# Patient Record
Sex: Female | Born: 1980 | Race: Black or African American | Hispanic: No | Marital: Single | Smoking: Never smoker
Health system: Southern US, Community
[De-identification: ages and names within clinical notes are randomized; demographics above are authoritative.]

## PROBLEM LIST (undated history)

## (undated) ENCOUNTER — Inpatient Hospital Stay (HOSPITAL_COMMUNITY): Payer: Self-pay

## (undated) DIAGNOSIS — R011 Cardiac murmur, unspecified: Secondary | ICD-10-CM

## (undated) DIAGNOSIS — D649 Anemia, unspecified: Secondary | ICD-10-CM

## (undated) DIAGNOSIS — O99210 Obesity complicating pregnancy, unspecified trimester: Secondary | ICD-10-CM

## (undated) DIAGNOSIS — Z98891 History of uterine scar from previous surgery: Secondary | ICD-10-CM

## (undated) HISTORY — PX: TONSILLECTOMY: SUR1361

## (undated) HISTORY — PX: OTHER SURGICAL HISTORY: SHX169

## (undated) HISTORY — PX: WISDOM TOOTH EXTRACTION: SHX21

## (undated) SURGERY — Surgical Case
Anesthesia: *Unknown

---

## 2012-11-07 ENCOUNTER — Encounter (HOSPITAL_COMMUNITY): Payer: Self-pay | Admitting: Emergency Medicine

## 2012-11-07 ENCOUNTER — Emergency Department (HOSPITAL_COMMUNITY)
Admission: EM | Admit: 2012-11-07 | Discharge: 2012-11-07 | Disposition: A | Discharge status: Home / Self Care | Payer: Medicaid Other | Attending: Emergency Medicine | Admitting: Emergency Medicine

## 2012-11-07 DIAGNOSIS — Z79899 Other long term (current) drug therapy: Secondary | ICD-10-CM | POA: Insufficient documentation

## 2012-11-07 DIAGNOSIS — R011 Cardiac murmur, unspecified: Secondary | ICD-10-CM | POA: Insufficient documentation

## 2012-11-07 DIAGNOSIS — K089 Disorder of teeth and supporting structures, unspecified: Secondary | ICD-10-CM | POA: Insufficient documentation

## 2012-11-07 DIAGNOSIS — R079 Chest pain, unspecified: Secondary | ICD-10-CM | POA: Insufficient documentation

## 2012-11-07 DIAGNOSIS — H9319 Tinnitus, unspecified ear: Secondary | ICD-10-CM | POA: Insufficient documentation

## 2012-11-07 DIAGNOSIS — Z3202 Encounter for pregnancy test, result negative: Secondary | ICD-10-CM | POA: Insufficient documentation

## 2012-11-07 DIAGNOSIS — M2550 Pain in unspecified joint: Secondary | ICD-10-CM | POA: Insufficient documentation

## 2012-11-07 DIAGNOSIS — J329 Chronic sinusitis, unspecified: Secondary | ICD-10-CM

## 2012-11-07 DIAGNOSIS — H9209 Otalgia, unspecified ear: Secondary | ICD-10-CM | POA: Insufficient documentation

## 2012-11-07 DIAGNOSIS — K0889 Other specified disorders of teeth and supporting structures: Secondary | ICD-10-CM

## 2012-11-07 DIAGNOSIS — J3489 Other specified disorders of nose and nasal sinuses: Secondary | ICD-10-CM | POA: Insufficient documentation

## 2012-11-07 HISTORY — DX: Cardiac murmur, unspecified: R01.1

## 2012-11-07 LAB — URINALYSIS, ROUTINE W REFLEX MICROSCOPIC
Bilirubin Urine: NEGATIVE
Nitrite: NEGATIVE
Specific Gravity, Urine: 1.02 (ref 1.005–1.030)
Urobilinogen, UA: 0.2 mg/dL (ref 0.0–1.0)

## 2012-11-07 LAB — URINE MICROSCOPIC-ADD ON

## 2012-11-07 MED ORDER — AMOXICILLIN-POT CLAVULANATE 500-125 MG PO TABS: 1.0000 | ORAL_TABLET | Freq: Three times a day (TID) | ORAL | Status: DC

## 2012-11-07 MED ORDER — OXYMETAZOLINE HCL 0.05 % NA SOLN: 2.0000 | Freq: Two times a day (BID) | NASAL | Status: DC

## 2012-11-07 NOTE — Progress Notes (Signed)
[facility] ED CM noted pt with medicaid coverage but no pcp listed Spoke with pt who confirms no pcp   [facility] ED CM spoke with pt on how to obtain an in network pcp with medicaid insurance coverage via DSS case worker (to prevent use of a non medicaid provider)  or web site Cm answered question about dental services and offered resources as following Pt reports having tooth removed in [place] on her left upper mouth and informed "nerve had to be removed" but told by dentist staff seen recently that they would not remove the nerve  [facility] - [phone]- orange card accessible [address] [facility] - [phone] Medication Assistance Program (MAP) [phone] Pharmacy -[phone] Vital [phone] [facility] Inf[facility]) - [phone] Dental Problems [facility] -Dr. Maurice March  No longer accept Medicaid Does see all other insurance plans & self pay with payment up front $[phone] [address] [id number] [address] --[phone] or [phone]  If unable to pay or uninsured, contact: [facility] to become qualified for the adult dental clinic. [facility] and [facility] both have dental clinics completed by students [facility] [phone] ext [phone] --[facility] [phone] & Nuala Alpha [address] [phone] approx $200 (paid up front) [facility] dental clinic [address] [place] [address] approx [phone]  DENTAL SERVICES UNINSURED/NO INSURANCE COVERAGE OR WITH INSURANCE hCALL 1 [phone]

## 2012-11-07 NOTE — ED Notes (Signed)
Pt states she has had a tooth ache for a week and at 4am this morning began having chest pain on the left side followed by a severe headache. Pt received antibiotics for tooth and is scheduled for surgery consultation in July to have tooth removed. Pt states she did not finish full course of antibiotics.

## 2012-11-07 NOTE — ED Provider Notes (Signed)
History     CSN: [id number]  Arrival date & time 11/07/12  1127   First MD Initiated Contact with Patient 11/07/12 1148      Chief Complaint  Patient presents with  . Chest Pain  . Dental Pain    (Consider location/radiation/quality/duration/timing/severity/associated sxs/prior treatment) HPI Pt with 1 week of L facial pain, L ear pain and L upper molar pain. Pt states she saw dentist yesterday and wanted the tooth removed. Dentist started her on abx and pain peds. Pt states pain worsened through the night and she noticed left upper chest pain worse with palpation. No SOB, cough, fever, chills, difficulty swallowing. No abd pain N/V/D. No focal weakness, numbness, or visual changes. +nasa congestion.  Past Medical History  Diagnosis Date  . Heart murmur     History reviewed. No pertinent past surgical history.  History reviewed. No pertinent family history.  History  Substance Use Topics  . Smoking status: Never Smoker   . Smokeless tobacco: Not on file  . Alcohol Use: Yes     Comment: occasionally    OB History   Grav Para Term Preterm Abortions TAB SAB Ect Mult Living   1               Review of Systems  Constitutional: Negative for fever and chills.  HENT: Positive for ear pain, congestion, dental problem, sinus pressure and tinnitus. Negative for sore throat, rhinorrhea, trouble swallowing, neck pain, neck stiffness and voice change.   Respiratory: Negative for cough, shortness of breath and wheezing.   Cardiovascular: Negative for chest pain, palpitations and leg swelling.  Gastrointestinal: Negative for nausea, vomiting, abdominal pain and diarrhea.  Genitourinary: Negative for dysuria, frequency and flank pain.  Musculoskeletal: Positive for myalgias. Negative for back pain and arthralgias.  Skin: Negative for pallor, rash and wound.  Neurological: Negative for dizziness, weakness, light-headedness, numbness and headaches.  All other systems reviewed and are  negative.    Allergies  Review of patient's allergies indicates no known allergies.  Home Medications   Current Outpatient Rx  Name  Route  Sig  Dispense  Refill  . acetaminophen-codeine (TYLENOL #3) 300-30 MG per tablet   Oral   Take 1 tablet by mouth every 4 (four) hours as needed for pain.         . Prenatal Vit-Fe Fumarate-FA (PRENATAL MULTIVITAMIN) TABS   Oral   Take 1 tablet by mouth every morning.         Marland Kitchen amoxicillin-clavulanate (AUGMENTIN) 500-125 MG per tablet   Oral   Take 1 tablet (500 mg total) by mouth every 8 (eight) hours.   21 tablet   0   . oxymetazoline (AFRIN NASAL SPRAY) 0.05 % nasal spray   Nasal   Place 2 sprays into the nose 2 (two) times daily.   30 mL   0     BP 118/78  Pulse 65  Temp(Src) 97.9 F (36.6 C) (Oral)  Resp 17  Ht 5\' 3"  (1.6 m)  Wt 340 lb (154.223 kg)  BMI 60.24 kg/m2  SpO2 99%  Physical Exam  Nursing note and vitals reviewed. Constitutional: She is oriented to person, place, and time. She appears well-developed and well-nourished. No distress.  HENT:  Head: Normocephalic and atraumatic.  Right Ear: External ear normal.  Left Ear: External ear normal.  Mouth/Throat: Oropharynx is clear and moist. No oropharyngeal exudate.    +L>R nasal turbinate swelling. L frontal sinus/maxillary sinus swelling.  Very mild TTP L  upper molar, No swelling or redness. +denal carry   Eyes: EOM are normal. Pupils are equal, round, and reactive to light.  Neck: Normal range of motion. Neck supple.  Cardiovascular: Normal rate and regular rhythm.   Pulmonary/Chest: Effort normal and breath sounds normal. No respiratory distress. She has no wheezes. She has no rales. She exhibits tenderness (L upper chest wall TTp. No deformity or crepitance).  Abdominal: Soft. Bowel sounds are normal. She exhibits no distension and no mass. There is no tenderness. There is no rebound and no guarding.  Musculoskeletal: Normal range of motion. She exhibits  no edema and no tenderness.  No calf swelling or pain  Neurological: She is alert and oriented to person, place, and time.  Moves all ext without deficit  Skin: Skin is warm and dry. No rash noted. No erythema.  Psychiatric: She has a normal mood and affect. Her behavior is normal.    ED Course  Procedures (including critical care time)  Labs Reviewed  URINALYSIS, ROUTINE W REFLEX MICROSCOPIC - Abnormal; Notable for the following:    APPearance CLOUDY (*)    Leukocytes, UA MODERATE (*)    All other components within normal limits  URINE MICROSCOPIC-ADD ON - Abnormal; Notable for the following:    Squamous Epithelial / LPF MANY (*)    Bacteria, UA MANY (*)    All other components within normal limits  POCT PREGNANCY, URINE - Abnormal; Notable for the following:    Preg Test, Ur POSITIVE (*)    All other components within normal limits  URINE CULTURE   No results found.   1. Sinusitis   2. Pain, dental       MDM  Will have f/u with dentist and will treat for sinusitis.         Loren Racer, MD 11/07/12 [phone]

## 2012-11-07 NOTE — ED Notes (Signed)
MD at bedside. 

## 2012-11-08 LAB — URINE CULTURE
Colony Count: NO GROWTH
Culture: NO GROWTH

## 2012-11-21 ENCOUNTER — Ambulatory Visit (INDEPENDENT_AMBULATORY_CARE_PROVIDER_SITE_OTHER): Payer: Medicaid Other | Admitting: Obstetrics

## 2012-11-21 ENCOUNTER — Encounter: Payer: Self-pay | Admitting: Obstetrics

## 2012-11-21 VITALS — BP 122/78 | Temp 98.6°F | Wt 329.4 lb

## 2012-11-21 DIAGNOSIS — Z3481 Encounter for supervision of other normal pregnancy, first trimester: Secondary | ICD-10-CM

## 2012-11-21 DIAGNOSIS — Z3480 Encounter for supervision of other normal pregnancy, unspecified trimester: Secondary | ICD-10-CM

## 2012-11-21 DIAGNOSIS — Z113 Encounter for screening for infections with a predominantly sexual mode of transmission: Secondary | ICD-10-CM

## 2012-11-21 LAB — POCT URINALYSIS DIPSTICK
Bilirubin, UA: NEGATIVE
Blood, UA: NEGATIVE
Glucose, UA: NEGATIVE
Nitrite, UA: NEGATIVE
Spec Grav, UA: 1.02

## 2012-11-21 NOTE — Progress Notes (Signed)
Pulse- 80.  Patient states that she is having slight cramping. . Subjective:    Christine Weber is being seen today for her first obstetrical visit.  This is not a planned pregnancy. She is at [redacted]w[redacted]d gestation. Her obstetrical history is significant for obesity. Relationship with FOB: significant other, living together. Patient does intend to breast feed. Pregnancy history fully reviewed.  Menstrual History: OB History   Grav Para Term Preterm Abortions TAB SAB Ect Mult Living   3 2 2       2       Menarche age: 50 :Patient's last menstrual period was 09/05/2012.    The following portions of the patient's history were reviewed and updated as appropriate: allergies, current medications, past family history, past medical history, past social history, past surgical history and problem list.  Review of Systems Pertinent items are noted in HPI.    Objective:    General appearance: alert and no distress Abdomen: normal findings: soft, non-tender Pelvic: cervix normal in appearance, external genitalia normal, no adnexal masses or tenderness, no cervical motion tenderness, uterus normal size, shape, and consistency and vagina normal without discharge    Assessment:    Pregnancy at [redacted]w[redacted]d weeks    Plan:    Initial labs drawn. Prenatal vitamins. Problem list reviewed and updated. AFP3 discussed: requested. Role of ultrasound in pregnancy discussed; fetal survey: requested. Amniocentesis discussed: not indicated. Follow up in 4 weeks. 50% of 20 min visit spent on counseling and coordination of care.

## 2012-11-22 ENCOUNTER — Encounter: Payer: Self-pay | Admitting: Obstetrics

## 2012-11-22 LAB — OBSTETRIC PANEL
Basophils Absolute: 0 10*3/uL (ref 0.0–0.1)
Basophils Relative: 0 % (ref 0–1)
Eosinophils Absolute: 0.1 10*3/uL (ref 0.0–0.7)
Hepatitis B Surface Ag: NEGATIVE
MCH: 26.8 pg (ref 26.0–34.0)
MCHC: 33.3 g/dL (ref 30.0–36.0)
Neutrophils Relative %: 54 % (ref 43–77)
Platelets: 200 10*3/uL (ref 150–400)
RBC: 4.29 MIL/uL (ref 3.87–5.11)
RDW: 17.1 % — ABNORMAL HIGH (ref 11.5–15.5)

## 2012-11-22 LAB — GC/CHLAMYDIA PROBE AMP
CT Probe RNA: NEGATIVE
GC Probe RNA: NEGATIVE

## 2012-11-22 LAB — HIV ANTIBODY (ROUTINE TESTING W REFLEX): HIV: NONREACTIVE

## 2012-11-22 LAB — WET PREP BY MOLECULAR PROBE
Candida species: NEGATIVE
Gardnerella vaginalis: POSITIVE — AB

## 2012-11-26 LAB — HEMOGLOBINOPATHY EVALUATION
Hemoglobin Other: 0 %
Hgb A2 Quant: 2.4 % (ref 2.2–3.2)
Hgb F Quant: 1.6 % (ref 0.0–2.0)

## 2012-12-19 ENCOUNTER — Encounter: Payer: Medicaid Other | Admitting: Obstetrics

## 2013-01-21 ENCOUNTER — Other Ambulatory Visit (HOSPITAL_COMMUNITY): Payer: Self-pay | Admitting: Obstetrics and Gynecology

## 2013-01-21 DIAGNOSIS — Z1231 Encounter for screening mammogram for malignant neoplasm of breast: Secondary | ICD-10-CM

## 2013-02-14 ENCOUNTER — Ambulatory Visit (HOSPITAL_COMMUNITY)
Admission: RE | Admit: 2013-02-14 | Discharge: 2013-02-14 | Disposition: A | Discharge status: Home / Self Care | Payer: Medicaid Other | Source: Ambulatory Visit | Attending: Obstetrics and Gynecology | Admitting: Obstetrics and Gynecology

## 2013-02-14 ENCOUNTER — Other Ambulatory Visit (HOSPITAL_COMMUNITY): Payer: Self-pay | Admitting: Obstetrics and Gynecology

## 2013-02-14 DIAGNOSIS — O358XX0 Maternal care for other (suspected) fetal abnormality and damage, not applicable or unspecified: Secondary | ICD-10-CM | POA: Insufficient documentation

## 2013-02-14 DIAGNOSIS — Z363 Encounter for antenatal screening for malformations: Secondary | ICD-10-CM | POA: Insufficient documentation

## 2013-02-14 DIAGNOSIS — Z1231 Encounter for screening mammogram for malignant neoplasm of breast: Secondary | ICD-10-CM

## 2013-02-14 DIAGNOSIS — Z1389 Encounter for screening for other disorder: Secondary | ICD-10-CM | POA: Insufficient documentation

## 2013-02-14 DIAGNOSIS — E669 Obesity, unspecified: Secondary | ICD-10-CM | POA: Insufficient documentation

## 2013-02-14 DIAGNOSIS — O34219 Maternal care for unspecified type scar from previous cesarean delivery: Secondary | ICD-10-CM | POA: Insufficient documentation

## 2013-03-13 ENCOUNTER — Other Ambulatory Visit (HOSPITAL_COMMUNITY): Payer: Self-pay | Admitting: Obstetrics and Gynecology

## 2013-03-13 DIAGNOSIS — E669 Obesity, unspecified: Secondary | ICD-10-CM

## 2013-03-14 ENCOUNTER — Ambulatory Visit (HOSPITAL_COMMUNITY)
Admission: RE | Admit: 2013-03-14 | Discharge: 2013-03-14 | Disposition: A | Discharge status: Home / Self Care | Payer: Medicaid Other | Source: Ambulatory Visit | Attending: Obstetrics and Gynecology | Admitting: Obstetrics and Gynecology

## 2013-03-14 ENCOUNTER — Other Ambulatory Visit (HOSPITAL_COMMUNITY): Payer: Self-pay | Admitting: Obstetrics and Gynecology

## 2013-03-14 DIAGNOSIS — O34219 Maternal care for unspecified type scar from previous cesarean delivery: Secondary | ICD-10-CM | POA: Insufficient documentation

## 2013-03-14 DIAGNOSIS — E669 Obesity, unspecified: Secondary | ICD-10-CM | POA: Insufficient documentation

## 2013-03-14 DIAGNOSIS — Z3689 Encounter for other specified antenatal screening: Secondary | ICD-10-CM | POA: Insufficient documentation

## 2013-04-02 ENCOUNTER — Other Ambulatory Visit (HOSPITAL_COMMUNITY): Payer: Self-pay | Admitting: Obstetrics and Gynecology

## 2013-04-02 DIAGNOSIS — O34219 Maternal care for unspecified type scar from previous cesarean delivery: Secondary | ICD-10-CM

## 2013-04-02 DIAGNOSIS — E669 Obesity, unspecified: Secondary | ICD-10-CM

## 2013-04-04 ENCOUNTER — Ambulatory Visit (HOSPITAL_COMMUNITY)
Admission: RE | Admit: 2013-04-04 | Discharge: 2013-04-04 | Disposition: A | Discharge status: Home / Self Care | Payer: Medicaid Other | Source: Ambulatory Visit | Attending: Obstetrics and Gynecology | Admitting: Obstetrics and Gynecology

## 2013-04-04 ENCOUNTER — Other Ambulatory Visit: Payer: Self-pay

## 2013-04-04 ENCOUNTER — Other Ambulatory Visit (HOSPITAL_COMMUNITY): Payer: Self-pay | Admitting: Obstetrics and Gynecology

## 2013-04-04 VITALS — BP 106/57 | HR 80 | Wt 328.0 lb

## 2013-04-04 DIAGNOSIS — O34219 Maternal care for unspecified type scar from previous cesarean delivery: Secondary | ICD-10-CM | POA: Insufficient documentation

## 2013-04-04 DIAGNOSIS — E669 Obesity, unspecified: Secondary | ICD-10-CM | POA: Insufficient documentation

## 2013-04-04 DIAGNOSIS — O365990 Maternal care for other known or suspected poor fetal growth, unspecified trimester, not applicable or unspecified: Secondary | ICD-10-CM | POA: Insufficient documentation

## 2013-04-04 DIAGNOSIS — O365931 Maternal care for other known or suspected poor fetal growth, third trimester, fetus 1: Secondary | ICD-10-CM

## 2013-04-04 NOTE — Progress Notes (Signed)
Christine Weber  was seen today for an ultrasound appointment.  See full report in [facility].  Impression: Single IUP at 30 1/7 weeks Estimated fetal weight today is at the 22nd %tile; the [facility] measures at the 5th %tile. Normal interval anatomy Active fetus with BPP of 8/8 UA Dopplers within normal limits for gestational age without AEDF or REDF Normal amniotic fluid volume  Recommendations: Recommend weekly BPPs and UA Dopplers until 32 weeks 2x weekly NSTs with weekly AFI and UA Dopplers after 32 weeks Follow up ultrasound for interval growth in 3 weeks  Alpha Gula, MD

## 2013-04-11 ENCOUNTER — Other Ambulatory Visit (HOSPITAL_COMMUNITY): Payer: Self-pay | Admitting: Maternal and Fetal Medicine

## 2013-04-11 ENCOUNTER — Ambulatory Visit (HOSPITAL_COMMUNITY)
Admission: RE | Admit: 2013-04-11 | Discharge: 2013-04-11 | Disposition: A | Discharge status: Home / Self Care | Payer: Medicaid Other | Source: Ambulatory Visit | Attending: Obstetrics and Gynecology | Admitting: Obstetrics and Gynecology

## 2013-04-11 DIAGNOSIS — E669 Obesity, unspecified: Secondary | ICD-10-CM | POA: Insufficient documentation

## 2013-04-11 DIAGNOSIS — O365931 Maternal care for other known or suspected poor fetal growth, third trimester, fetus 1: Secondary | ICD-10-CM

## 2013-04-11 DIAGNOSIS — O34219 Maternal care for unspecified type scar from previous cesarean delivery: Secondary | ICD-10-CM | POA: Insufficient documentation

## 2013-04-11 DIAGNOSIS — O365990 Maternal care for other known or suspected poor fetal growth, unspecified trimester, not applicable or unspecified: Secondary | ICD-10-CM | POA: Insufficient documentation

## 2013-04-18 ENCOUNTER — Ambulatory Visit (HOSPITAL_COMMUNITY)
Admission: RE | Admit: 2013-04-18 | Discharge: 2013-04-18 | Disposition: A | Discharge status: Home / Self Care | Payer: Medicaid Other | Source: Ambulatory Visit | Attending: Obstetrics and Gynecology | Admitting: Obstetrics and Gynecology

## 2013-04-18 DIAGNOSIS — O34219 Maternal care for unspecified type scar from previous cesarean delivery: Secondary | ICD-10-CM | POA: Insufficient documentation

## 2013-04-18 DIAGNOSIS — O365990 Maternal care for other known or suspected poor fetal growth, unspecified trimester, not applicable or unspecified: Secondary | ICD-10-CM | POA: Insufficient documentation

## 2013-04-18 DIAGNOSIS — E669 Obesity, unspecified: Secondary | ICD-10-CM | POA: Insufficient documentation

## 2013-04-18 DIAGNOSIS — O365931 Maternal care for other known or suspected poor fetal growth, third trimester, fetus 1: Secondary | ICD-10-CM

## 2013-04-18 NOTE — Progress Notes (Signed)
Christine Weber  was seen today for an ultrasound appointment.  See full report in [facility].  Impression: Single IUP at 32 1/7 weeks Lagging growth BPP 8/8 Normal UA Doppler studies for gestational age Normal amniotic fluid volume  Recommendations: 2x weekly NSTs with weekly AFI and UA Dopplers Follow up ultrasound for interval growth in 2 weeks  Alpha Gula, MD

## 2013-04-24 ENCOUNTER — Ambulatory Visit (HOSPITAL_COMMUNITY)
Admission: RE | Admit: 2013-04-24 | Discharge: 2013-04-24 | Disposition: A | Discharge status: Home / Self Care | Payer: Medicaid Other | Source: Ambulatory Visit | Attending: Obstetrics and Gynecology | Admitting: Obstetrics and Gynecology

## 2013-04-24 DIAGNOSIS — O34219 Maternal care for unspecified type scar from previous cesarean delivery: Secondary | ICD-10-CM | POA: Insufficient documentation

## 2013-04-24 DIAGNOSIS — O365990 Maternal care for other known or suspected poor fetal growth, unspecified trimester, not applicable or unspecified: Secondary | ICD-10-CM | POA: Insufficient documentation

## 2013-04-24 DIAGNOSIS — O368190 Decreased fetal movements, unspecified trimester, not applicable or unspecified: Secondary | ICD-10-CM | POA: Insufficient documentation

## 2013-04-24 DIAGNOSIS — O365931 Maternal care for other known or suspected poor fetal growth, third trimester, fetus 1: Secondary | ICD-10-CM

## 2013-04-24 DIAGNOSIS — E669 Obesity, unspecified: Secondary | ICD-10-CM | POA: Insufficient documentation

## 2013-05-02 ENCOUNTER — Other Ambulatory Visit (HOSPITAL_COMMUNITY): Payer: Self-pay | Admitting: Maternal and Fetal Medicine

## 2013-05-02 ENCOUNTER — Ambulatory Visit (HOSPITAL_COMMUNITY)
Admission: RE | Admit: 2013-05-02 | Discharge: 2013-05-02 | Disposition: A | Discharge status: Home / Self Care | Payer: Medicaid Other | Source: Ambulatory Visit | Attending: Obstetrics and Gynecology | Admitting: Obstetrics and Gynecology

## 2013-05-02 DIAGNOSIS — O365931 Maternal care for other known or suspected poor fetal growth, third trimester, fetus 1: Secondary | ICD-10-CM

## 2013-05-02 DIAGNOSIS — O34219 Maternal care for unspecified type scar from previous cesarean delivery: Secondary | ICD-10-CM | POA: Insufficient documentation

## 2013-05-02 DIAGNOSIS — O365990 Maternal care for other known or suspected poor fetal growth, unspecified trimester, not applicable or unspecified: Secondary | ICD-10-CM | POA: Insufficient documentation

## 2013-05-02 DIAGNOSIS — O368190 Decreased fetal movements, unspecified trimester, not applicable or unspecified: Secondary | ICD-10-CM | POA: Insufficient documentation

## 2013-05-02 DIAGNOSIS — E669 Obesity, unspecified: Secondary | ICD-10-CM | POA: Insufficient documentation

## 2013-05-02 NOTE — Progress Notes (Signed)
Christine Weber was seen for ultrasound appointment today.  Please see [facility] report for details.

## 2013-05-08 ENCOUNTER — Other Ambulatory Visit (HOSPITAL_COMMUNITY): Payer: Self-pay | Admitting: Obstetrics and Gynecology

## 2013-05-08 DIAGNOSIS — O10919 Unspecified pre-existing hypertension complicating pregnancy, unspecified trimester: Secondary | ICD-10-CM

## 2013-05-09 ENCOUNTER — Encounter (HOSPITAL_COMMUNITY): Payer: Self-pay | Admitting: *Deleted

## 2013-05-09 ENCOUNTER — Ambulatory Visit (HOSPITAL_COMMUNITY)
Admission: RE | Admit: 2013-05-09 | Discharge: 2013-05-09 | Disposition: A | Discharge status: Home / Self Care | Payer: Medicaid Other | Source: Ambulatory Visit | Attending: Obstetrics | Admitting: Obstetrics

## 2013-05-09 ENCOUNTER — Ambulatory Visit (HOSPITAL_COMMUNITY): Payer: Medicaid Other

## 2013-05-09 ENCOUNTER — Inpatient Hospital Stay (HOSPITAL_COMMUNITY)
Admission: AD | Admit: 2013-05-09 | Discharge: 2013-05-09 | Disposition: A | Discharge status: Home / Self Care | Payer: Medicaid Other | Source: Ambulatory Visit | Attending: Obstetrics and Gynecology | Admitting: Obstetrics and Gynecology

## 2013-05-09 DIAGNOSIS — A084 Viral intestinal infection, unspecified: Secondary | ICD-10-CM

## 2013-05-09 DIAGNOSIS — O212 Late vomiting of pregnancy: Secondary | ICD-10-CM | POA: Insufficient documentation

## 2013-05-09 DIAGNOSIS — O10919 Unspecified pre-existing hypertension complicating pregnancy, unspecified trimester: Secondary | ICD-10-CM

## 2013-05-09 DIAGNOSIS — E669 Obesity, unspecified: Secondary | ICD-10-CM | POA: Insufficient documentation

## 2013-05-09 DIAGNOSIS — O99891 Other specified diseases and conditions complicating pregnancy: Secondary | ICD-10-CM | POA: Insufficient documentation

## 2013-05-09 DIAGNOSIS — O365990 Maternal care for other known or suspected poor fetal growth, unspecified trimester, not applicable or unspecified: Secondary | ICD-10-CM | POA: Insufficient documentation

## 2013-05-09 DIAGNOSIS — O368190 Decreased fetal movements, unspecified trimester, not applicable or unspecified: Secondary | ICD-10-CM | POA: Insufficient documentation

## 2013-05-09 DIAGNOSIS — O34219 Maternal care for unspecified type scar from previous cesarean delivery: Secondary | ICD-10-CM | POA: Insufficient documentation

## 2013-05-09 DIAGNOSIS — K5289 Other specified noninfective gastroenteritis and colitis: Secondary | ICD-10-CM | POA: Insufficient documentation

## 2013-05-09 DIAGNOSIS — R197 Diarrhea, unspecified: Secondary | ICD-10-CM | POA: Insufficient documentation

## 2013-05-09 DIAGNOSIS — A088 Other specified intestinal infections: Secondary | ICD-10-CM

## 2013-05-09 MED ORDER — FAMOTIDINE IN NACL 20-0.9 MG/50ML-% IV SOLN
20.0000 mg | Freq: Once | INTRAVENOUS | Status: AC
  Administered 2013-05-09: 20 mg via INTRAVENOUS
  Filled 2013-05-09: qty 50

## 2013-05-09 MED ORDER — ONDANSETRON 8 MG PO TBDP
8.0000 mg | ORAL_TABLET | Freq: Once | ORAL | Status: AC
  Administered 2013-05-09: 8 mg via ORAL
  Filled 2013-05-09: qty 1

## 2013-05-09 MED ORDER — PROMETHAZINE HCL 25 MG PO TABS: 25.0000 mg | ORAL_TABLET | Freq: Four times a day (QID) | ORAL | Status: DC | PRN

## 2013-05-09 MED ORDER — DEXTROSE 5 % IN LACTATED RINGERS IV BOLUS
1000.0000 mL | Freq: Once | INTRAVENOUS | Status: AC
  Administered 2013-05-09: 1000 mL via INTRAVENOUS

## 2013-05-09 NOTE — MAU Note (Addendum)
Patient presents to MAU with c/o nausea and vomiting since yesterday; states is unable to keep anything down. Reports being around other family members with nausea and vomiting. Denies LOF, VB, nor contractions. REports good fetal movement.

## 2013-05-09 NOTE — MAU Provider Note (Signed)
  History     CSN: [id number]  Arrival date and time: 05/09/13 1457   First Provider Initiated Contact with Patient 05/09/13 1524      Chief Complaint  Patient presents with  . Nausea  . Emesis   HPI  Pt is 35 wk 1 d pregnant G3P2022 who presents with nausea and vomiting and diarrhea since yesterday. Pt's last bout of diarrhea 3 hours ago- has had 5 times today.  Pt has been vomiting about 10 times today- fluids. Pt had appointment at MFM for fluid measurement.  Pt is scheduled for C/S on 05/22/2013 for SGA. Pt is also have increased amount of heartburn  Past Medical History  Diagnosis Date  . Heart murmur     Past Surgical History  Procedure Laterality Date  . Cesarean section    . Oral surgry    . Tonsillectomy      Family History  Problem Relation Age of Onset  . Diabetes Mother   . Hyperlipidemia Mother   . Thyroid disease Mother   . Hypertension Mother   . Cancer Father     Kidney  . Hypertension Brother   . Diabetes Brother   . Deep vein thrombosis Brother   . Cancer Maternal Grandfather     Prostate  . Kidney disease Paternal Grandmother   . Cancer Paternal Grandfather     Prostate    History  Substance Use Topics  . Smoking status: Never Smoker   . Smokeless tobacco: Never Used  . Alcohol Use: No     Comment: occasionally    Allergies: No Known Allergies  Prescriptions prior to admission  Medication Sig Dispense Refill  . Prenatal Vit-Fe Fumarate-FA (PRENATAL MULTIVITAMIN) TABS Take 1 tablet by mouth every morning.        Review of Systems  Constitutional: Negative for fever and chills.  Gastrointestinal: Positive for heartburn, nausea, vomiting, abdominal pain and diarrhea.  Genitourinary: Negative for urgency.   Physical Exam   Blood pressure 121/82, pulse 96, temperature 97.7 F (36.5 C), temperature source Oral, resp. rate 20, height 5\' 3"  (1.6 m), weight 144.584 kg (318 lb 12 oz), last menstrual period 09/05/2012, SpO2  99.00%.  Physical Exam  Nursing note and vitals reviewed. Constitutional: She is oriented to person, place, and time. She appears well-developed and well-nourished. No distress.  HENT:  Head: Normocephalic.  Eyes: Pupils are equal, round, and reactive to light.  Neck: Normal range of motion.  Cardiovascular: Normal rate.   Respiratory: Effort normal.  GI: Soft.  Musculoskeletal: Normal range of motion.  Neurological: She is alert and oriented to person, place, and time.  Skin: Skin is warm and dry.  Psychiatric: She has a normal mood and affect.    MAU Course  Procedures Dr. Ambrose Mantle notified of pt's clinical presentation IVF D5LR with Pepcid 20mg  IV and zofran 8mg  ODT given- pt felt much better Pt did not have any vomiting or diarrhea after fluids and medications- ready to go home Assessment and Plan  Viral gastroenteritis in pregnancy BRAT diet, increase in fluid F/u with OB appointment   [facility] 05/09/2013, 3:24 PM

## 2013-05-14 ENCOUNTER — Other Ambulatory Visit (HOSPITAL_COMMUNITY): Payer: Self-pay | Admitting: Obstetrics and Gynecology

## 2013-05-14 DIAGNOSIS — O365990 Maternal care for other known or suspected poor fetal growth, unspecified trimester, not applicable or unspecified: Secondary | ICD-10-CM

## 2013-05-14 DIAGNOSIS — O365931 Maternal care for other known or suspected poor fetal growth, third trimester, fetus 1: Secondary | ICD-10-CM

## 2013-05-14 DIAGNOSIS — E669 Obesity, unspecified: Secondary | ICD-10-CM

## 2013-05-14 DIAGNOSIS — O34219 Maternal care for unspecified type scar from previous cesarean delivery: Secondary | ICD-10-CM

## 2013-05-14 DIAGNOSIS — O368190 Decreased fetal movements, unspecified trimester, not applicable or unspecified: Secondary | ICD-10-CM

## 2013-05-15 ENCOUNTER — Other Ambulatory Visit (HOSPITAL_COMMUNITY): Payer: Self-pay | Admitting: Obstetrics and Gynecology

## 2013-05-15 DIAGNOSIS — E669 Obesity, unspecified: Secondary | ICD-10-CM

## 2013-05-15 DIAGNOSIS — O34219 Maternal care for unspecified type scar from previous cesarean delivery: Secondary | ICD-10-CM

## 2013-05-15 DIAGNOSIS — O365931 Maternal care for other known or suspected poor fetal growth, third trimester, fetus 1: Secondary | ICD-10-CM

## 2013-05-15 DIAGNOSIS — O368190 Decreased fetal movements, unspecified trimester, not applicable or unspecified: Secondary | ICD-10-CM

## 2013-05-16 ENCOUNTER — Ambulatory Visit (HOSPITAL_COMMUNITY)
Admission: RE | Admit: 2013-05-16 | Discharge: 2013-05-16 | Disposition: A | Discharge status: Home / Self Care | Payer: Medicaid Other | Source: Ambulatory Visit | Attending: Obstetrics | Admitting: Obstetrics

## 2013-05-16 ENCOUNTER — Other Ambulatory Visit (HOSPITAL_COMMUNITY): Payer: Medicaid Other

## 2013-05-16 DIAGNOSIS — O365990 Maternal care for other known or suspected poor fetal growth, unspecified trimester, not applicable or unspecified: Secondary | ICD-10-CM | POA: Insufficient documentation

## 2013-05-16 DIAGNOSIS — O368190 Decreased fetal movements, unspecified trimester, not applicable or unspecified: Secondary | ICD-10-CM | POA: Insufficient documentation

## 2013-05-16 DIAGNOSIS — O34219 Maternal care for unspecified type scar from previous cesarean delivery: Secondary | ICD-10-CM | POA: Insufficient documentation

## 2013-05-16 DIAGNOSIS — E669 Obesity, unspecified: Secondary | ICD-10-CM | POA: Insufficient documentation

## 2013-05-16 DIAGNOSIS — O365931 Maternal care for other known or suspected poor fetal growth, third trimester, fetus 1: Secondary | ICD-10-CM

## 2013-05-20 ENCOUNTER — Encounter (HOSPITAL_COMMUNITY): Payer: Self-pay | Admitting: *Deleted

## 2013-05-20 ENCOUNTER — Other Ambulatory Visit (HOSPITAL_COMMUNITY): Payer: Medicaid Other

## 2013-05-21 ENCOUNTER — Encounter (HOSPITAL_COMMUNITY): Payer: Self-pay | Admitting: Obstetrics and Gynecology

## 2013-05-21 ENCOUNTER — Encounter (HOSPITAL_COMMUNITY)
Admission: RE | Admit: 2013-05-21 | Discharge: 2013-05-21 | Disposition: A | Discharge status: Home / Self Care | Payer: Medicaid Other | Source: Ambulatory Visit | Attending: Obstetrics and Gynecology | Admitting: Obstetrics and Gynecology

## 2013-05-21 ENCOUNTER — Encounter (HOSPITAL_COMMUNITY): Payer: Self-pay

## 2013-05-21 DIAGNOSIS — O99210 Obesity complicating pregnancy, unspecified trimester: Secondary | ICD-10-CM

## 2013-05-21 HISTORY — DX: Obesity complicating pregnancy, unspecified trimester: O99.210

## 2013-05-21 HISTORY — DX: Anemia, unspecified: D64.9

## 2013-05-21 LAB — CBC
Hemoglobin: 11.2 g/dL — ABNORMAL LOW (ref 12.0–15.0)
MCH: 27.9 pg (ref 26.0–34.0)
MCHC: 33 g/dL (ref 30.0–36.0)
MCV: 84.3 fL (ref 78.0–100.0)
RBC: 4.02 MIL/uL (ref 3.87–5.11)
RDW: 14.8 % (ref 11.5–15.5)

## 2013-05-21 MED ORDER — DEXTROSE 5 % IV SOLN
3.0000 g | INTRAVENOUS | Status: AC
  Administered 2013-05-22: 3 g via INTRAVENOUS
  Filled 2013-05-21: qty 3000

## 2013-05-21 NOTE — Patient Instructions (Addendum)
   Your procedure is scheduled on: Wednesday, Dec 24   Enter through the [employer] [facility] at:  6 AM Pick up the phone at the desk and dial [phone] and inform us of your arrival.  Please call this number if you have any problems the morning of surgery: [phone]  Remember: Do not eat or drink after midnight: Tuesday Take these medicines the morning of surgery with a SIP OF WATER:  None  Do not wear jewelry (including body piercing), make-up, or FINGER nail polish No metal in your hair or on your body.  Do not wear lotions, powders, perfumes.  You may wear deodorant.   Do not bring valuables to the hospital. Contacts, dentures or bridgework may not be worn into surgery.  Leave suitcase in the car. After Surgery it may be brought to your room. For patients being admitted to the hospital, checkout time is 11:00am the day of discharge. Home with boyfriend Torrin cell [phone].

## 2013-05-21 NOTE — H&P (Signed)
Christine Weber is a 32 y.o. female G3P2002 at 37 wk for rLTCS secondary to fetal IUGR, nl dopplers and BPP.  Pregnancy complicated by IUGR, followed by BPP and dopplers, AFI.  Also GBBS +.  D/W pt r/b/a of LTCS, declines BTL.  Desires low horizontal incision despite counseling.    Maternal Medical History:  Contractions: Frequency: irregular.   Perceived severity is mild.    Fetal activity: Perceived fetal activity is normal.    Prenatal complications: IUGR.   Prenatal Complications - Diabetes: none.    OB History   Grav Para Term Preterm Abortions TAB SAB Ect Mult Living   3 2 2       2     G1 2/07 LTCS 7#15 female G2 10/12 LTCS 7# 9, female No STDs No abn pap  Past Medical History  Diagnosis Date  . Heart murmur     as a child - no problem as adult  . Anemia   . Obesity complicating pregnancy 05/21/2013   Past Surgical History  Procedure Laterality Date  . Oral surgry    . Tonsillectomy    . Cesarean section  2007, 2012    x 2  in [place]  . Wisdom tooth extraction     Family History: family history includes Cancer in her father, maternal grandfather, and paternal grandfather; Deep vein thrombosis in her brother; Diabetes in her brother and mother; Hyperlipidemia in her mother; Hypertension in her brother and mother; Kidney disease in her paternal grandmother; Thyroid disease in her mother. Social History:  reports that she has never smoked. She has never used smokeless tobacco. She reports that she drinks alcohol. She reports that she does not use illicit drugs. Meds PNV All NKDA   Prenatal Transfer Tool  Maternal Diabetes: No Genetic Screening: Normal Maternal Ultrasounds/Referrals: Abnormal:  Findings:   IUGR Fetal Ultrasounds or other Referrals:  Referred to Materal Fetal Medicine  Maternal Substance Abuse:  No Significant Maternal Medications:  None Significant Maternal Lab Results:  Lab values include: Group B Strep positive Other Comments:  morbid maternal  obesity; IUGR followed with nl dopplers, AFI and BPP  Review of Systems  Constitutional: Negative.   HENT: Negative.   Eyes: Negative.   Respiratory: Negative.   Cardiovascular: Negative.   Gastrointestinal: Negative.   Genitourinary: Negative.   Musculoskeletal: Negative.   Skin: Negative.   Neurological: Negative.   Psychiatric/Behavioral: Negative.       Last menstrual period 09/05/2012. Maternal Exam:  Uterine Assessment: Contraction frequency is irregular.   Abdomen: Surgical scars: low transverse.   Fundal height is 40 wk.   Estimated fetal weight is 6#.   Fetal presentation: vertex  Introitus: Normal vulva. Normal vagina.  Pelvis: adequate for delivery.   Cervix: Cervix evaluated by digital exam.     Physical Exam  Constitutional: She is oriented to person, place, and time. She appears well-developed and well-nourished.  HENT:  Head: Normocephalic and atraumatic.  Cardiovascular: Normal rate and regular rhythm.   Respiratory: Breath sounds normal. No respiratory distress. She has no wheezes.  GI: Soft. Bowel sounds are normal. She exhibits no distension. There is no tenderness.  Morbid obesity  Musculoskeletal: Normal range of motion.  Neurological: She is alert and oriented to person, place, and time.  Skin: Skin is warm and dry.  Psychiatric: She has a normal mood and affect. Her behavior is normal.    Prenatal labs: ABO, Rh: --/--/O POS, O POS (12/23 0820) Antibody: NEG (12/23 0820) Rubella: 2.21 (06/25  1544) RPR: NON REACTIVE (12/23 0820)  HBsAg: NEGATIVE (06/25 1544)  HIV: NON REACTIVE (06/25 1544)  GBS:  positive Hgb 11.5/ Varicella Nonimmune/Ur Cx neg/ Hgb electro WNL/ GC neg/ Chl neg/ glucola 108/ 1st Tri Screen WNL  [place] cwd, ant plac, female limited anat [place] cwd, MFM 21% growth, nl anat Followed with 8/8 BPP, nl dopplers Growth < 10%   Assessment/Plan: 32yo G3P2002 for rLTCS at 37 wk given IUGR/ MFM reccs. Ancef for prophylaxis D/W pt r/b/a -  wishes to proceed  BOVARD,Grayce Budden 05/21/2013, 6:03 PM

## 2013-05-22 ENCOUNTER — Inpatient Hospital Stay (HOSPITAL_COMMUNITY): Payer: Medicaid Other | Admitting: Anesthesiology

## 2013-05-22 ENCOUNTER — Encounter (HOSPITAL_COMMUNITY): Payer: Self-pay | Admitting: *Deleted

## 2013-05-22 ENCOUNTER — Encounter (HOSPITAL_COMMUNITY): Payer: Medicaid Other | Admitting: Anesthesiology

## 2013-05-22 ENCOUNTER — Encounter (HOSPITAL_COMMUNITY)
Admission: RE | Disposition: A | Discharge status: Home / Self Care | Payer: Self-pay | Source: Ambulatory Visit | Attending: Obstetrics and Gynecology

## 2013-05-22 ENCOUNTER — Inpatient Hospital Stay (HOSPITAL_COMMUNITY)
Admission: RE | Admit: 2013-05-22 | Discharge: 2013-05-25 | DRG: 765 | Disposition: A | Discharge status: Home / Self Care | Payer: Medicaid Other | Source: Ambulatory Visit | Attending: Obstetrics and Gynecology | Admitting: Obstetrics and Gynecology

## 2013-05-22 DIAGNOSIS — O99210 Obesity complicating pregnancy, unspecified trimester: Secondary | ICD-10-CM

## 2013-05-22 DIAGNOSIS — O99892 Other specified diseases and conditions complicating childbirth: Secondary | ICD-10-CM | POA: Diagnosis present

## 2013-05-22 DIAGNOSIS — O365990 Maternal care for other known or suspected poor fetal growth, unspecified trimester, not applicable or unspecified: Secondary | ICD-10-CM | POA: Diagnosis present

## 2013-05-22 DIAGNOSIS — Z22330 Carrier of Group B streptococcus: Secondary | ICD-10-CM

## 2013-05-22 DIAGNOSIS — O34219 Maternal care for unspecified type scar from previous cesarean delivery: Principal | ICD-10-CM | POA: Diagnosis present

## 2013-05-22 DIAGNOSIS — Z98891 History of uterine scar from previous surgery: Secondary | ICD-10-CM

## 2013-05-22 HISTORY — DX: History of uterine scar from previous surgery: Z98.891

## 2013-05-22 HISTORY — DX: Obesity complicating pregnancy, unspecified trimester: O99.210

## 2013-05-22 LAB — BIRTH TISSUE RECOVERY COLLECTION (PLACENTA DONATION)

## 2013-05-22 SURGERY — Surgical Case
Anesthesia: Spinal | Site: Abdomen

## 2013-05-22 MED ORDER — IBUPROFEN 800 MG PO TABS
800.0000 mg | ORAL_TABLET | Freq: Three times a day (TID) | ORAL | Status: DC
  Administered 2013-05-22 – 2013-05-25 (×8): 800 mg via ORAL
  Filled 2013-05-22 (×8): qty 1

## 2013-05-22 MED ORDER — SCOPOLAMINE 1 MG/3DAYS TD PT72
1.0000 | MEDICATED_PATCH | Freq: Once | TRANSDERMAL | Status: DC
  Administered 2013-05-22: 1.5 mg via TRANSDERMAL

## 2013-05-22 MED ORDER — DIBUCAINE 1 % RE OINT
1.0000 "application " | TOPICAL_OINTMENT | RECTAL | Status: DC | PRN
  Filled 2013-05-22: qty 28

## 2013-05-22 MED ORDER — PRENATAL MULTIVITAMIN CH
1.0000 | ORAL_TABLET | Freq: Every day | ORAL | Status: DC
  Administered 2013-05-23 – 2013-05-24 (×2): 1 via ORAL
  Filled 2013-05-22 (×5): qty 1

## 2013-05-22 MED ORDER — ONDANSETRON HCL 4 MG/2ML IJ SOLN
INTRAMUSCULAR | Status: DC | PRN
  Administered 2013-05-22: 4 mg via INTRAVENOUS

## 2013-05-22 MED ORDER — LACTATED RINGERS IV SOLN
INTRAVENOUS | Status: DC
  Administered 2013-05-22: 16:00:00 via INTRAVENOUS

## 2013-05-22 MED ORDER — MORPHINE SULFATE (PF) 0.5 MG/ML IJ SOLN
INTRAMUSCULAR | Status: DC | PRN
  Administered 2013-05-22: .1 mg via INTRATHECAL

## 2013-05-22 MED ORDER — PHENYLEPHRINE 8 MG IN D5W 100 ML (0.08MG/ML) PREMIX OPTIME
INJECTION | INTRAVENOUS | Status: AC
  Filled 2013-05-22: qty 100

## 2013-05-22 MED ORDER — MENTHOL 3 MG MT LOZG
1.0000 | LOZENGE | OROMUCOSAL | Status: DC | PRN
  Filled 2013-05-22: qty 9

## 2013-05-22 MED ORDER — LACTATED RINGERS IV SOLN
Freq: Once | INTRAVENOUS | Status: AC
  Administered 2013-05-22: 06:00:00 via INTRAVENOUS

## 2013-05-22 MED ORDER — OXYTOCIN 10 UNIT/ML IJ SOLN
40.0000 [IU] | INTRAMUSCULAR | Status: DC | PRN
  Administered 2013-05-22: 40 [IU] via INTRAVENOUS

## 2013-05-22 MED ORDER — DIPHENHYDRAMINE HCL 25 MG PO CAPS
25.0000 mg | ORAL_CAPSULE | ORAL | Status: DC | PRN
  Filled 2013-05-22: qty 1

## 2013-05-22 MED ORDER — KETOROLAC TROMETHAMINE 30 MG/ML IJ SOLN: 30.0000 mg | Freq: Four times a day (QID) | INTRAMUSCULAR | Status: AC | PRN

## 2013-05-22 MED ORDER — PROMETHAZINE HCL 25 MG/ML IJ SOLN: 6.2500 mg | INTRAMUSCULAR | Status: DC | PRN

## 2013-05-22 MED ORDER — SODIUM BICARBONATE 8.4 % IV SOLN
INTRAVENOUS | Status: DC | PRN
  Administered 2013-05-22: 2 mL via EPIDURAL
  Administered 2013-05-22: 3 mL via EPIDURAL

## 2013-05-22 MED ORDER — NALOXONE HCL 1 MG/ML IJ SOLN
1.0000 ug/kg/h | INTRAVENOUS | Status: DC | PRN
  Filled 2013-05-22: qty 2

## 2013-05-22 MED ORDER — SENNOSIDES-DOCUSATE SODIUM 8.6-50 MG PO TABS
2.0000 | ORAL_TABLET | ORAL | Status: DC
  Administered 2013-05-22: 2 via ORAL
  Filled 2013-05-22 (×3): qty 2

## 2013-05-22 MED ORDER — WITCH HAZEL-GLYCERIN EX PADS: 1.0000 "application " | MEDICATED_PAD | CUTANEOUS | Status: DC | PRN

## 2013-05-22 MED ORDER — ONDANSETRON HCL 4 MG PO TABS: 4.0000 mg | ORAL_TABLET | ORAL | Status: DC | PRN

## 2013-05-22 MED ORDER — OXYTOCIN 40 UNITS IN LACTATED RINGERS INFUSION - SIMPLE MED: 62.5000 mL/h | INTRAVENOUS | Status: AC

## 2013-05-22 MED ORDER — SIMETHICONE 80 MG PO CHEW
80.0000 mg | CHEWABLE_TABLET | ORAL | Status: DC
  Administered 2013-05-22 – 2013-05-24 (×3): 80 mg via ORAL
  Filled 2013-05-22 (×5): qty 1

## 2013-05-22 MED ORDER — DIPHENHYDRAMINE HCL 50 MG/ML IJ SOLN: 25.0000 mg | INTRAMUSCULAR | Status: DC | PRN

## 2013-05-22 MED ORDER — DIPHENHYDRAMINE HCL 50 MG/ML IJ SOLN: 12.5000 mg | INTRAMUSCULAR | Status: DC | PRN

## 2013-05-22 MED ORDER — KETOROLAC TROMETHAMINE 30 MG/ML IJ SOLN
15.0000 mg | Freq: Once | INTRAMUSCULAR | Status: AC | PRN
  Administered 2013-05-22: 30 mg via INTRAVENOUS

## 2013-05-22 MED ORDER — IBUPROFEN 600 MG PO TABS: 600.0000 mg | ORAL_TABLET | Freq: Four times a day (QID) | ORAL | Status: DC | PRN

## 2013-05-22 MED ORDER — LIDOCAINE-EPINEPHRINE 2 %-1:100000 IJ SOLN: INTRAMUSCULAR | Status: DC | PRN

## 2013-05-22 MED ORDER — ONDANSETRON HCL 4 MG/2ML IJ SOLN: 4.0000 mg | INTRAMUSCULAR | Status: DC | PRN

## 2013-05-22 MED ORDER — METOCLOPRAMIDE HCL 5 MG/ML IJ SOLN: 10.0000 mg | Freq: Three times a day (TID) | INTRAMUSCULAR | Status: DC | PRN

## 2013-05-22 MED ORDER — FENTANYL CITRATE 0.05 MG/ML IJ SOLN
INTRAMUSCULAR | Status: AC
  Filled 2013-05-22: qty 2

## 2013-05-22 MED ORDER — ONDANSETRON HCL 4 MG/2ML IJ SOLN
INTRAMUSCULAR | Status: AC
  Filled 2013-05-22: qty 2

## 2013-05-22 MED ORDER — NALOXONE HCL 0.4 MG/ML IJ SOLN: 0.4000 mg | INTRAMUSCULAR | Status: DC | PRN

## 2013-05-22 MED ORDER — MEPERIDINE HCL 25 MG/ML IJ SOLN: 6.2500 mg | INTRAMUSCULAR | Status: DC | PRN

## 2013-05-22 MED ORDER — SIMETHICONE 80 MG PO CHEW
80.0000 mg | CHEWABLE_TABLET | Freq: Three times a day (TID) | ORAL | Status: DC
  Administered 2013-05-23 – 2013-05-24 (×6): 80 mg via ORAL
  Filled 2013-05-22 (×12): qty 1

## 2013-05-22 MED ORDER — KETOROLAC TROMETHAMINE 30 MG/ML IJ SOLN
INTRAMUSCULAR | Status: AC
  Filled 2013-05-22: qty 1

## 2013-05-22 MED ORDER — BUPIVACAINE IN DEXTROSE 0.75-8.25 % IT SOLN
INTRATHECAL | Status: DC | PRN
  Administered 2013-05-22: 1.2 mL via INTRATHECAL

## 2013-05-22 MED ORDER — LACTATED RINGERS IV SOLN
INTRAVENOUS | Status: DC
  Administered 2013-05-22 (×3): via INTRAVENOUS

## 2013-05-22 MED ORDER — FENTANYL CITRATE 0.05 MG/ML IJ SOLN
INTRAMUSCULAR | Status: DC | PRN
  Administered 2013-05-22: 15 ug via INTRATHECAL

## 2013-05-22 MED ORDER — PHENYLEPHRINE 8 MG IN D5W 100 ML (0.08MG/ML) PREMIX OPTIME
INJECTION | INTRAVENOUS | Status: DC | PRN
  Administered 2013-05-22: 60 ug/min via INTRAVENOUS

## 2013-05-22 MED ORDER — NALBUPHINE HCL 10 MG/ML IJ SOLN
5.0000 mg | INTRAMUSCULAR | Status: DC | PRN
  Filled 2013-05-22: qty 1

## 2013-05-22 MED ORDER — MORPHINE SULFATE 0.5 MG/ML IJ SOLN
INTRAMUSCULAR | Status: AC
  Filled 2013-05-22: qty 10

## 2013-05-22 MED ORDER — OXYCODONE-ACETAMINOPHEN 5-325 MG PO TABS
1.0000 | ORAL_TABLET | ORAL | Status: DC | PRN
  Administered 2013-05-23 – 2013-05-24 (×2): 0.5 via ORAL
  Filled 2013-05-22 (×2): qty 1

## 2013-05-22 MED ORDER — SCOPOLAMINE 1 MG/3DAYS TD PT72
MEDICATED_PATCH | TRANSDERMAL | Status: AC
  Administered 2013-05-22: 1.5 mg via TRANSDERMAL
  Filled 2013-05-22: qty 1

## 2013-05-22 MED ORDER — ZOLPIDEM TARTRATE 5 MG PO TABS: 5.0000 mg | ORAL_TABLET | Freq: Every evening | ORAL | Status: DC | PRN

## 2013-05-22 MED ORDER — KETOROLAC TROMETHAMINE 30 MG/ML IJ SOLN
30.0000 mg | Freq: Four times a day (QID) | INTRAMUSCULAR | Status: AC | PRN
  Administered 2013-05-22: 30 mg via INTRAVENOUS
  Filled 2013-05-22: qty 1

## 2013-05-22 MED ORDER — OXYTOCIN 10 UNIT/ML IJ SOLN
INTRAMUSCULAR | Status: AC
  Filled 2013-05-22: qty 4

## 2013-05-22 MED ORDER — SIMETHICONE 80 MG PO CHEW
80.0000 mg | CHEWABLE_TABLET | ORAL | Status: DC | PRN
  Filled 2013-05-22: qty 1

## 2013-05-22 MED ORDER — DIPHENHYDRAMINE HCL 25 MG PO CAPS
25.0000 mg | ORAL_CAPSULE | Freq: Four times a day (QID) | ORAL | Status: DC | PRN
  Filled 2013-05-22: qty 1

## 2013-05-22 MED ORDER — SODIUM CHLORIDE 0.9 % IJ SOLN: 3.0000 mL | INTRAMUSCULAR | Status: DC | PRN

## 2013-05-22 MED ORDER — ONDANSETRON HCL 4 MG/2ML IJ SOLN: 4.0000 mg | Freq: Three times a day (TID) | INTRAMUSCULAR | Status: DC | PRN

## 2013-05-22 MED ORDER — NALBUPHINE HCL 10 MG/ML IJ SOLN
5.0000 mg | INTRAMUSCULAR | Status: DC | PRN
  Administered 2013-05-22: 10 mg via SUBCUTANEOUS
  Filled 2013-05-22 (×2): qty 1

## 2013-05-22 MED ORDER — LANOLIN HYDROUS EX OINT: 1.0000 "application " | TOPICAL_OINTMENT | CUTANEOUS | Status: DC | PRN

## 2013-05-22 MED ORDER — HYDROMORPHONE HCL PF 1 MG/ML IJ SOLN
INTRAMUSCULAR | Status: AC
  Filled 2013-05-22: qty 1

## 2013-05-22 MED ORDER — HYDROMORPHONE HCL PF 1 MG/ML IJ SOLN
0.2500 mg | INTRAMUSCULAR | Status: DC | PRN
  Administered 2013-05-22: 0.5 mg via INTRAVENOUS

## 2013-05-22 SURGICAL SUPPLY — 36 items
BINDER ABD UNIV 12 45-62 (WOUND CARE) ×1 IMPLANT
BINDER ABDOMINAL 46IN 62IN (WOUND CARE) ×2
CLAMP CORD UMBIL (MISCELLANEOUS) IMPLANT
CLOTH BEACON ORANGE TIMEOUT ST (SAFETY) ×2 IMPLANT
CONTAINER PREFILL 10% NBF 15ML (MISCELLANEOUS) IMPLANT
DRAPE LG THREE QUARTER DISP (DRAPES) IMPLANT
DRSG OPSITE POSTOP 4X10 (GAUZE/BANDAGES/DRESSINGS) ×2 IMPLANT
DURAPREP 26ML APPLICATOR (WOUND CARE) ×2 IMPLANT
ELECT REM PT RETURN 9FT ADLT (ELECTROSURGICAL) ×2
ELECTRODE REM PT RTRN 9FT ADLT (ELECTROSURGICAL) ×1 IMPLANT
EXTRACTOR VACUUM M CUP 4 TUBE (SUCTIONS) IMPLANT
GLOVE BIO SURGEON STRL SZ 6.5 (GLOVE) ×2 IMPLANT
GOWN PREVENTION PLUS XLARGE (GOWN DISPOSABLE) ×2 IMPLANT
GOWN STRL REIN XL XLG (GOWN DISPOSABLE) ×2 IMPLANT
KIT ABG SYR 3ML LUER SLIP (SYRINGE) IMPLANT
NEEDLE HYPO 25X5/8 SAFETYGLIDE (NEEDLE) IMPLANT
NS IRRIG 1000ML POUR BTL (IV SOLUTION) ×2 IMPLANT
PACK C SECTION WH (CUSTOM PROCEDURE TRAY) ×2 IMPLANT
PAD OB MATERNITY 4.3X12.25 (PERSONAL CARE ITEMS) ×2 IMPLANT
RTRCTR C-SECT PINK 25CM LRG (MISCELLANEOUS) ×2 IMPLANT
STAPLER VISISTAT 35W (STAPLE) IMPLANT
SUT MNCRL 0 VIOLET CTX 36 (SUTURE) ×2 IMPLANT
SUT MONOCRYL 0 CTX 36 (SUTURE) ×2
SUT PLAIN 1 NONE 54 (SUTURE) IMPLANT
SUT PLAIN 2 0 XLH (SUTURE) ×2 IMPLANT
SUT VIC AB 0 CT1 27 (SUTURE) ×2
SUT VIC AB 0 CT1 27XBRD ANBCTR (SUTURE) ×2 IMPLANT
SUT VIC AB 2-0 CT1 27 (SUTURE) ×1
SUT VIC AB 2-0 CT1 TAPERPNT 27 (SUTURE) ×1 IMPLANT
SUT VIC AB 3-0 SH 27 (SUTURE) ×1
SUT VIC AB 3-0 SH 27X BRD (SUTURE) ×1 IMPLANT
SUT VIC AB 4-0 KS 27 (SUTURE) ×2 IMPLANT
SYR BULB IRRIGATION 50ML (SYRINGE) ×2 IMPLANT
TOWEL OR 17X24 6PK STRL BLUE (TOWEL DISPOSABLE) ×2 IMPLANT
TRAY FOLEY CATH 14FR (SET/KITS/TRAYS/PACK) ×2 IMPLANT
WATER STERILE IRR 1000ML POUR (IV SOLUTION) ×2 IMPLANT

## 2013-05-22 NOTE — Anesthesia Postprocedure Evaluation (Signed)
  Anesthesia Post-op Note  Anesthesia Post Note  Patient: Christine Weber  Procedure(s) Performed: Procedure(s) (LRB): CESAREAN SECTION (N/A)  Anesthesia type: Spinal/Epidural  Patient location: PACU  Post pain: Pain level controlled  Post assessment: Post-op Vital signs reviewed  Last Vitals:  Filed Vitals:   05/22/13 0945  BP:   Pulse: 76  Temp:   Resp: 17    Post vital signs: Reviewed  Level of consciousness: awake  Complications: No apparent anesthesia complications

## 2013-05-22 NOTE — Transfer of Care (Signed)
Immediate Anesthesia Transfer of Care Note  Patient: Christine Weber  Procedure(s) Performed: Procedure(s): CESAREAN SECTION (N/A)  Patient Location: PACU  Anesthesia Type:Spinal  Level of Consciousness: awake, alert  and oriented  Airway & Oxygen Therapy: Patient Spontanous Breathing  Post-op Assessment: Report given to PACU RN and Post -op Vital signs reviewed and stable  Post vital signs: Reviewed and stable  Complications: No apparent anesthesia complications

## 2013-05-22 NOTE — Anesthesia Procedure Notes (Signed)
Spinal  Patient location during procedure: OR Start time: 05/22/2013 7:30 AM Staffing Performed by: anesthesiologist  Preanesthetic Checklist Completed: patient identified, site marked, surgical consent, pre-op evaluation, timeout performed, IV checked, risks and benefits discussed and monitors and equipment checked Spinal Block Patient position: sitting Prep: site prepped and draped and DuraPrep Patient monitoring: cardiac monitor, continuous pulse ox, blood pressure and heart rate Approach: midline Location: L3-4 Injection technique: catheter Needle Needle type: Tuohy and Pencan  Needle gauge: 24 G Needle length: 12.7 cm Needle insertion depth: 8 cm Catheter type: closed end flexible Catheter size: 19 g Catheter at skin depth: 13 cm Assessment Sensory level: T4 Additional Notes LOR to air on first pass at 8 cm.  Pencan via tuohy with immediate return of free flow CSF.  SAB dose given, Pencan removed, tuoy flushed with 2.5 ml saline.  Epidural catheter threaded to 13 cm at skin.  Pt then positioned in RLD for taping.  Epidural retracted to approx 15 cm with skin/sub-q movement.  Secured at that depth.  EPIDURAL NOT TESTED.  Pt tolerated procedure well with no apparent complications.  Jasmine December, MD

## 2013-05-22 NOTE — Interval H&P Note (Signed)
History and Physical Interval Note:  05/22/2013 7:09 AM  Christine Weber  has presented today for surgery, with the diagnosis of Repeat C/Section, apprvd by MFM  The various methods of treatment have been discussed with the patient and family. After consideration of risks, benefits and other options for treatment, the patient has consented to  Procedure(s): CESAREAN SECTION (N/A) as a surgical intervention .  The patient's history has been reviewed, patient examined, no change in status, stable for surgery.  I have reviewed the patient's chart and labs.  Questions were answered to the patient's satisfaction.     BOVARD,Alandria Butkiewicz

## 2013-05-22 NOTE — Anesthesia Preprocedure Evaluation (Signed)
Anesthesia Evaluation  Patient identified by MRN, date of birth, ID band Patient awake    Reviewed: Allergy & Precautions, H&P , NPO status , Patient's Chart, lab work & pertinent test results  Airway Mallampati: III TM Distance: >3 FB Neck ROM: full    Dental no notable dental hx.    Pulmonary neg pulmonary ROS,    Pulmonary exam normal       Cardiovascular negative cardio ROS      Neuro/Psych negative neurological ROS  negative psych ROS   GI/Hepatic negative GI ROS, Neg liver ROS,   Endo/Other  Morbid obesity  Renal/GU negative Renal ROS     Musculoskeletal   Abdominal (+) + obese,   Peds  Hematology negative hematology ROS (+)   Anesthesia Other Findings   Reproductive/Obstetrics (+) Pregnancy                           Anesthesia Physical Anesthesia Plan  ASA: III  Anesthesia Plan: Spinal and Combined Spinal and Epidural   Post-op Pain Management:    Induction:   Airway Management Planned:   Additional Equipment:   Intra-op Plan:   Post-operative Plan:   Informed Consent: I have reviewed the patients History and Physical, chart, labs and discussed the procedure including the risks, benefits and alternatives for the proposed anesthesia with the patient or authorized representative who has indicated his/her understanding and acceptance.     Plan Discussed with: CRNA, Anesthesiologist and Surgeon  Anesthesia Plan Comments:         Anesthesia Quick Evaluation

## 2013-05-22 NOTE — Anesthesia Postprocedure Evaluation (Signed)
  Anesthesia Post-op Note  Patient: Christine Weber  Procedure(s) Performed: Procedure(s): CESAREAN SECTION (N/A)  Patient Location: Mother/Baby  Anesthesia Type:Spinal  Level of Consciousness: awake  Airway and Oxygen Therapy: Patient Spontanous Breathing  Post-op Pain: mild  Post-op Assessment: Patient's Cardiovascular Status Stable and Respiratory Function Stable  Post-op Vital Signs: stable  Complications: No apparent anesthesia complications

## 2013-05-22 NOTE — Op Note (Signed)
NAMEYAMILETH, HAYSE           ACCOUNT NO.:  [id number]  MEDICAL RECORD NO.:  [id number]  LOCATION:  [facility]                          FACILITY:  [facility]  PHYSICIAN:  Sherron Monday, MD        DATE OF BIRTH:  12-Jun-1980  DATE OF PROCEDURE:  05/22/2013 DATE OF DISCHARGE:                              OPERATIVE REPORT   PREOPERATIVE DIAGNOSIS:  Intrauterine pregnancy at 37 weeks, with severe IUGR, growth less than 10th percentile, for repeat C-section approved by a maternal fetal medicine.  POSTOPERATIVE DIAGNOSIS:  Intrauterine pregnancy at 37 weeks, with severe IUGR, growth less than 10th percentile, for repeat C-section approved by a maternal fetal medicine, delivered.  PROCEDURE:  Repeat low-transverse cesarean section.  SURGEON:  Sherron Monday, MD  ASSISTANT:  Zenaida Niece, M.D.  ANESTHESIA:  Combined spinal epidural.  IV FLUIDS:  2300.  URINE OUTPUT:  200 mL clear urine at the end of the procedure.  EBL:  600 mL.  COMPLICATIONS: case being complicated by the patient's morbid obesity with preoperative taping for visualization being somewhat difficult and making visualization and manipulation for delivery difficult.  PATHOLOGY:  Placenta to L and D.  PROCEDURE:  After informed consent was reviewed with the patient and her family, she was transported to the OR where combined spinal and epidural anesthesia was placed.  She was then placed on the table in supine position with a leftward tilt.  Her pannus was taped with Hypofix to the bed as well as using pink tape taped to her upper abdomen.  She was then prepped and draped in the normal sterile fashion.  Foley catheter was sterilely placed.  A Pfannenstiel skin incision was made at the level of previous incision and carried through the underlying layer of fascia sharply.  The fascia was incised in the midline and the midline incision was extended laterally with Mayo scissors.  The superior aspect of the fascial incision  was grasped with Kocher clamps, elevated, and the rectus muscles were dissected off both bluntly and sharply. Attention was turned to finding the midline, was found tented up with hemostats and the peritoneum was entered bluntly.  The peritoneal incision was extended superiorly and inferiorly with good visualization of the bladder.  After this was done, An Alexis skin retractor was placed carefully making sure no bowel was entrapped.  Uterine exploration was performed.  The uterine incision was made and carried transversely and extended manually.  The infant was easily delivered from a vertex presentation.  Nose and mouth were suctioned on the field.  Cord was clamped and cut.  Infant was handed off to the awaiting pediatric staff.  The placenta was expressed from the uterus.  The uterus was cleared of all clot and debris. Uterine incision was closed with 2 layers of 0 Monocryl 1st which was a running locked and the 2nd is an imbricating layer.  An additional area of bleeding on the uterus were a likely adhesion mid anterior was repaired with a figure-of-eight of 3-0 Vicryl.  The uterus, tubes, and ovaries were inspected and found to be in normal.  The gutters were cleared of all clot and debris.  Peritoneum was reapproximated using 2-0 Vicryl in  a running fashion.  The subfascial planes were inspected, found to be hemostatic.  Fascia was then reapproximated using 0 Vicryl from either end overlapping in the midline.  The subcuticular adipose layer was made hemostatic with Bovie cautery and that space was closed with 3-0 plain gut.  The skin was closed with 4-0 Vicryl on a Keith needle.  Three stay sutures of 0 Prolene were also placed given the patient's morbid obesity to help with incisional integrity.  Sponge, lap, and needle counts were correct x2.     Sherron Monday, MD     JB/MEDQ  D:  05/22/2013  T:  05/22/2013  Job:  [id number]

## 2013-05-22 NOTE — Lactation Note (Signed)
This note was copied from the chart of Christine Weber. Lactation Consultation Note  Patient Name: Christine Alize Borrayo [id number] Date: 05/22/2013     Maternal Data Formula Feeding for Exclusion: Yes Reason for exclusion: Mother's choice to formula feed on admision  Feeding    LATCH Score/Interventions                      Lactation Tools Discussed/Used     Consult Status Consult Status: Complete    Hardie Pulley 05/22/2013, 5:16 PM

## 2013-05-22 NOTE — Brief Op Note (Signed)
05/22/2013  9:07 AM  PATIENT:  Christine Weber  32 y.o. female  PRE-OPERATIVE DIAGNOSIS:  Repeat C/Section, apprvd by MFM  POST-OPERATIVE DIAGNOSIS:  Repeat C/Section, apprvd by MFM  PROCEDURE:  Procedure(s): CESAREAN SECTION (N/A)  SURGEON:  Surgeon(s) and Role:    * Sherron Monday, MD - Primary    * Lavina Hamman, MD - Assisting  ANESTHESIA:   epidural and spinal  EBL:  Total I/O In: 2300 [I.V.:2300] Out: 800 [Urine:200; Blood:600]  BLOOD ADMINISTERED:none  DRAINS: Urinary Catheter (Foley)   LOCAL MEDICATIONS USED:  NONE  SPECIMEN:  Source of Specimen:  Placenta  DISPOSITION OF SPECIMEN:  L&D  COUNTS:  YES  TOURNIQUET:  * No tourniquets in log *  DICTATION: .Other Dictation: Dictation Number [id number]   PLAN OF CARE: Admit to inpatient   PATIENT DISPOSITION:  PACU - hemodynamically stable.   Delay start of Pharmacological VTE agent (>24hrs) due to surgical blood loss or risk of bleeding: not applicable

## 2013-05-23 LAB — CBC
HCT: 27.6 % — ABNORMAL LOW (ref 36.0–46.0)
Hemoglobin: 9 g/dL — ABNORMAL LOW (ref 12.0–15.0)
RBC: 3.28 MIL/uL — ABNORMAL LOW (ref 3.87–5.11)
WBC: 7.4 10*3/uL (ref 4.0–10.5)

## 2013-05-23 NOTE — Progress Notes (Signed)
Patient ID: Christine Weber, female   DOB: 1980-08-05, 32 y.o.   MRN: [id number] #1 afebrile BP normal HGB stable Output good. Abdomen soft and not tender

## 2013-05-24 ENCOUNTER — Encounter (HOSPITAL_COMMUNITY): Payer: Self-pay | Admitting: Obstetrics and Gynecology

## 2013-05-24 NOTE — Progress Notes (Signed)
Patient ID: Christine Weber, female   DOB: 1981-03-24, 32 y.o.   MRN: [id number] #[id number] afebrile BP normal Pt is tolerating a regular diet and passing flatus. She is ambulating and voiding well. She requests d/c. Her only complaint is stinging in the incision but there is no drainage or swelling.

## 2013-05-24 NOTE — Progress Notes (Signed)
UR chart review completed.  

## 2013-05-24 NOTE — Progress Notes (Signed)
Patient ID: Christine Weber, female   DOB: 1980-06-08, 32 y.o.   MRN: [id number] Pt has elected to cancel d/c

## 2013-05-25 LAB — TYPE AND SCREEN
ABO/RH(D): O POS
Unit division: 0
Unit division: 0

## 2013-05-25 MED ORDER — IBUPROFEN 800 MG PO TABS: 800.0000 mg | ORAL_TABLET | Freq: Three times a day (TID) | ORAL | Status: DC

## 2013-05-25 MED ORDER — OXYCODONE-ACETAMINOPHEN 5-325 MG PO TABS: 1.0000 | ORAL_TABLET | ORAL | Status: DC | PRN

## 2013-05-25 NOTE — Discharge Summary (Signed)
Obstetric Discharge Summary Reason for Admission: cesarean section Prenatal Procedures: NST and ultrasound Intrapartum Procedures: cesarean: low cervical, transverse Postpartum Procedures: none Complications-Operative and Postpartum: none Hemoglobin  Date Value Range Status  05/23/2013 9.0* 12.0 - 15.0 g/dL Final     DELTA CHECK NOTED     REPEATED TO VERIFY     HCT  Date Value Range Status  05/23/2013 27.6* 36.0 - 46.0 % Final    Physical Exam:  General: alert Lochia: appropriate Uterine Fundus: firm Incision: healing well  Discharge Diagnoses: Term Pregnancy-delivered  Discharge Information: Date: 05/25/2013 Activity: pelvic rest and no strenuous activity Diet: routine Medications: Ibuprofen and Percocet Condition: stable Instructions: refer to practice specific booklet Discharge to: home Follow-up Information   Follow up with BOVARD,JODY, MD In 2 weeks.   Specialty:  Obstetrics and Gynecology   Contact information:   [address] [place] [address] [phone]       Follow up with BOVARD,JODY, MD. Schedule an appointment as soon as possible for a visit in 2 weeks.   Specialty:  Obstetrics and Gynecology   Contact information:   [address] [place] [address] [phone]       Newborn Data: Live born female  Birth Weight: 5 lb 5.9 oz (2435 g) APGAR: 9, 9  Home with mother.  Christine Weber 05/25/2013, 9:35 AM

## 2013-05-25 NOTE — Progress Notes (Signed)
POD #3 Doing ok, still some stinging in incision Afeb, VSS Abd- fundus firm, dressing intact, incision intact as far as I can tell D/c home

## 2014-03-31 ENCOUNTER — Encounter (HOSPITAL_COMMUNITY): Payer: Self-pay | Admitting: Obstetrics and Gynecology

## 2014-12-06 ENCOUNTER — Inpatient Hospital Stay (HOSPITAL_COMMUNITY)
Admission: AD | Admit: 2014-12-06 | Discharge: 2014-12-06 | Disposition: A | Discharge status: Home / Self Care | Payer: Medicaid Other | Source: Ambulatory Visit | Attending: Obstetrics and Gynecology | Admitting: Obstetrics and Gynecology

## 2014-12-06 ENCOUNTER — Encounter (HOSPITAL_COMMUNITY): Payer: Self-pay

## 2014-12-06 DIAGNOSIS — N390 Urinary tract infection, site not specified: Secondary | ICD-10-CM

## 2014-12-06 DIAGNOSIS — R109 Unspecified abdominal pain: Secondary | ICD-10-CM | POA: Insufficient documentation

## 2014-12-06 DIAGNOSIS — O2342 Unspecified infection of urinary tract in pregnancy, second trimester: Secondary | ICD-10-CM | POA: Diagnosis not present

## 2014-12-06 DIAGNOSIS — B3731 Acute candidiasis of vulva and vagina: Secondary | ICD-10-CM

## 2014-12-06 DIAGNOSIS — Z3A19 19 weeks gestation of pregnancy: Secondary | ICD-10-CM | POA: Diagnosis not present

## 2014-12-06 DIAGNOSIS — O26899 Other specified pregnancy related conditions, unspecified trimester: Secondary | ICD-10-CM

## 2014-12-06 DIAGNOSIS — B373 Candidiasis of vulva and vagina: Secondary | ICD-10-CM | POA: Diagnosis not present

## 2014-12-06 DIAGNOSIS — O98812 Other maternal infectious and parasitic diseases complicating pregnancy, second trimester: Secondary | ICD-10-CM | POA: Insufficient documentation

## 2014-12-06 LAB — URINE MICROSCOPIC-ADD ON

## 2014-12-06 LAB — URINALYSIS, ROUTINE W REFLEX MICROSCOPIC
BILIRUBIN URINE: NEGATIVE
Glucose, UA: NEGATIVE mg/dL
KETONES UR: NEGATIVE mg/dL
NITRITE: NEGATIVE
PROTEIN: 30 mg/dL — AB
SPECIFIC GRAVITY, URINE: 1.025 (ref 1.005–1.030)
Urobilinogen, UA: 0.2 mg/dL (ref 0.0–1.0)
pH: 6.5 (ref 5.0–8.0)

## 2014-12-06 LAB — WET PREP, GENITAL
Clue Cells Wet Prep HPF POC: NONE SEEN
Trich, Wet Prep: NONE SEEN

## 2014-12-06 MED ORDER — TERCONAZOLE 0.4 % VA CREA: 1.0000 | TOPICAL_CREAM | Freq: Every day | VAGINAL | Status: DC

## 2014-12-06 MED ORDER — NITROFURANTOIN MONOHYD MACRO 100 MG PO CAPS: 100.0000 mg | ORAL_CAPSULE | Freq: Two times a day (BID) | ORAL | Status: DC

## 2014-12-06 NOTE — MAU Note (Signed)
Pt states has had upper abdominal cramping for past three days. Denies bleeding however thinks still may have BV. Also issues with constipation.

## 2014-12-06 NOTE — MAU Provider Note (Signed)
History     CSN: [id number]  Arrival date and time: 12/06/14 1115   None     Chief Complaint  Patient presents with  . Abdominal Cramping   HPI   Ms.Christine Weber is a 34 y.o. female [phone] at 3212w3d presenting to MAU with abdominal cramping and decreased urination. For the past three days the patient describes braxton hicks type cramping in her upper stomach. The pain wraps around to the upper part of her back; both sides. She is not having trouble eating; although she does have constipation.   She is out of town during the week taking care of her mom, she has not been to her Dr. Isidore Moosffice appointments since 13 weeks. She came here today because she has not been seen and had to cancel her last appointment.   She drinks three cups of juice per day and 3 cups of water per day. She feels that she is only urinating 2-3 times per day.   OB History    Gravida Para Term Preterm AB TAB SAB Ectopic Multiple Living   4 3 3       3       Past Medical History  Diagnosis Date  . Heart murmur     as a child - no problem as adult  . Anemia   . Obesity complicating pregnancy 05/21/2013  . S/P cesarean section 05/22/2013    Past Surgical History  Procedure Laterality Date  . Oral surgry    . Tonsillectomy    . Cesarean section  2007, 2012    x 2  in [place]  . Wisdom tooth extraction    . Cesarean section N/A 05/22/2013    Procedure: CESAREAN SECTION;  Surgeon: Sherron MondayJody Bovard, MD;  Location: [facility];  Service: Obstetrics;  Laterality: N/A;    Family History  Problem Relation Age of Onset  . Diabetes Mother   . Hyperlipidemia Mother   . Thyroid disease Mother   . Hypertension Mother   . Cancer Father     Kidney  . Hypertension Brother   . Diabetes Brother   . Deep vein thrombosis Brother   . Cancer Maternal Grandfather     Prostate  . Kidney disease Paternal Grandmother   . Cancer Paternal Grandfather     Prostate    History  Substance Use Topics  . Smoking status: Never Smoker    . Smokeless tobacco: Never Used  . Alcohol Use: Yes     Comment: occasionally nut none with pregnancy    Allergies: No Known Allergies  Prescriptions prior to admission  Medication Sig Dispense Refill Last Dose  . metroNIDAZOLE (FLAGYL) 500 MG tablet Take 500 mg by mouth 2 (two) times daily.   Past Month at Unknown time  . Prenatal Vit-Fe Fumarate-FA (PRENATAL MULTIVITAMIN) TABS Take 1 tablet by mouth every morning.   12/06/2014 at Unknown time  . ibuprofen (ADVIL,MOTRIN) 800 MG tablet Take 1 tablet (800 mg total) by mouth every 8 (eight) hours. (Patient not taking: Reported on 12/06/2014) 30 tablet 0 Not Taking at Unknown time  . oxyCODONE-acetaminophen (PERCOCET/ROXICET) 5-325 MG per tablet Take 1-2 tablets by mouth every 4 (four) hours as needed for severe pain (moderate - severe pain). (Patient not taking: Reported on 12/06/2014) 30 tablet 0 Not Taking at Unknown time  . promethazine (PHENERGAN) 25 MG tablet Take 1 tablet (25 mg total) by mouth every 6 (six) hours as needed for nausea or vomiting. (Patient not taking: Reported on 12/06/2014) 30 tablet  0 Not Taking at Unknown time   Results for orders placed or performed during the hospital encounter of 12/06/14 (from the past 48 hour(s))  Urinalysis, Routine w reflex microscopic (not at [facility])     Status: Abnormal   Collection Time: 12/06/14 11:40 AM  Result Value Ref Range   Color, Urine YELLOW YELLOW   APPearance CLOUDY (A) CLEAR   Specific Gravity, Urine 1.025 1.005 - 1.030   pH 6.5 5.0 - 8.0   Glucose, UA NEGATIVE NEGATIVE mg/dL   Hgb urine dipstick SMALL (A) NEGATIVE   Bilirubin Urine NEGATIVE NEGATIVE   Ketones, ur NEGATIVE NEGATIVE mg/dL   Protein, ur 30 (A) NEGATIVE mg/dL   Urobilinogen, UA 0.2 0.0 - 1.0 mg/dL   Nitrite NEGATIVE NEGATIVE   Leukocytes, UA MODERATE (A) NEGATIVE  Urine microscopic-add on     Status: Abnormal   Collection Time: 12/06/14 11:40 AM  Result Value Ref Range   Squamous Epithelial / LPF MANY (A) RARE    WBC, UA 7-10 <3 WBC/hpf   RBC / HPF 3-6 <3 RBC/hpf   Bacteria, UA MANY (A) RARE  Wet prep, genital     Status: Abnormal   Collection Time: 12/06/14  1:05 PM  Result Value Ref Range   Yeast Wet Prep HPF POC FEW (A) NONE SEEN   Trich, Wet Prep NONE SEEN NONE SEEN   Clue Cells Wet Prep HPF POC NONE SEEN NONE SEEN   WBC, Wet Prep HPF POC FEW (A) NONE SEEN    Comment: MANY BACTERIA SEEN    Review of Systems  Gastrointestinal: Positive for abdominal pain and constipation. Negative for nausea, vomiting and diarrhea.  Genitourinary: Negative for dysuria, urgency and frequency.   Physical Exam   Blood pressure 124/72, pulse 108, temperature 98 F (36.7 C), temperature source Oral, resp. rate 16, height  (1.6 m), weight 153.826 kg (339 lb 2 oz), not currently breastfeeding.  Physical Exam  Constitutional: She is oriented to person, place, and time. She appears well-developed and well-nourished.  Obese patient   HENT:  Head: Normocephalic.  GI: There is generalized tenderness. There is no CVA tenderness.  Genitourinary:  Bimanual exam: Cervix closed, large amount of thick, white, clumpy discharge noted on exam glove.  Uterus non tender, gravid  Adnexa non tender, no masses bilaterally, + suprapubic tenderness  Wet prep done Chaperone present for exam.  Neurological: She is alert and oriented to person, place, and time.  Skin: Skin is warm.  Psychiatric: Her behavior is normal.    MAU Course  Procedures  None  MDM  + fetal heart tones via doppler   Urine culture pending  Discussed patient with Dr. Jackelyn Knife; ok to treat for yeast and UTI.   Assessment and Plan   A:  1. Yeast vaginitis   2. UTI (lower urinary tract infection)   3. Abdominal pain in pregnancy    P:  Discharge home in stable condition RX: Terazol 7, Macrobid  Follow up with your OB doctor, it is important to receive regular prenatal care.  Return to MAU if symptoms worsen Increase po fluid  intake    Duane Lope, NP 12/06/2014 12:56 PM

## 2014-12-08 LAB — URINE CULTURE: SPECIAL REQUESTS: NORMAL

## 2014-12-18 ENCOUNTER — Other Ambulatory Visit (HOSPITAL_COMMUNITY): Payer: Self-pay | Admitting: Obstetrics & Gynecology

## 2014-12-18 ENCOUNTER — Ambulatory Visit (HOSPITAL_COMMUNITY)
Admission: RE | Admit: 2014-12-18 | Discharge: 2014-12-18 | Disposition: A | Discharge status: Home / Self Care | Payer: Medicaid Other | Source: Ambulatory Visit | Attending: Obstetrics & Gynecology | Admitting: Obstetrics & Gynecology

## 2014-12-18 ENCOUNTER — Encounter (HOSPITAL_COMMUNITY): Payer: Self-pay

## 2014-12-18 DIAGNOSIS — IMO0002 Reserved for concepts with insufficient information to code with codable children: Secondary | ICD-10-CM | POA: Insufficient documentation

## 2014-12-18 DIAGNOSIS — R9389 Abnormal findings on diagnostic imaging of other specified body structures: Secondary | ICD-10-CM

## 2014-12-18 DIAGNOSIS — Z3A21 21 weeks gestation of pregnancy: Secondary | ICD-10-CM | POA: Insufficient documentation

## 2014-12-18 DIAGNOSIS — O365920 Maternal care for other known or suspected poor fetal growth, second trimester, not applicable or unspecified: Secondary | ICD-10-CM | POA: Diagnosis present

## 2014-12-18 DIAGNOSIS — O358XX0 Maternal care for other (suspected) fetal abnormality and damage, not applicable or unspecified: Secondary | ICD-10-CM | POA: Insufficient documentation

## 2014-12-18 DIAGNOSIS — O321XX0 Maternal care for breech presentation, not applicable or unspecified: Secondary | ICD-10-CM | POA: Diagnosis not present

## 2014-12-18 DIAGNOSIS — O26849 Uterine size-date discrepancy, unspecified trimester: Secondary | ICD-10-CM | POA: Insufficient documentation

## 2014-12-18 LAB — KLEIHAUER-BETKE STAIN
# Vials RhIg: 1
Fetal Cells %: 0 %
QUANTITATION FETAL HEMOGLOBIN: 0 mL

## 2014-12-18 LAB — TYPE AND SCREEN
ABO/RH(D): O POS
Antibody Screen: NEGATIVE

## 2014-12-18 NOTE — Progress Notes (Signed)
Consult note  34 year old, [id number], currently at 21 weeks 1 day gestation and EDD of 04/29/2015. She is being evaluated today for IUGR and fetal pericardial effusion.   She notes that this pregnancy has been relatively uncomplicated except for the entire family having respiratory flu symptoms around the time of conception/very early pregnancy (~March 9th) and her daughter having a cold about 2 weeks ago. Otherwise the pregnancy has been uncomplicated to her visit today which prompted the consult  Her obstetric history as noted in the chart includes 3 cesarean delivery's. The first for fetal distress (MAS in the child but currently doing well) and repeat x2. The first two children were born at 83 and 39 weeks and weighed 7 pounds 15 ounces and 7 pounds 9 ounces, and last delivered at 37 weeks for IUGR and weighing 5 pounds 5 ounces.   NKDA PMHx - anemia, obesity PSHx - wisdom teeth, tonsils SocHx - neg tob, etoh, herbal over the counter or illicit drugs, neg STDs FmHx - M DM, Thyroid, CHTN; F tumor kidney; brother DVT, CHTN, DM,   #1 Fetal pericardial effusion - Ultrasound today Singleton intrauterine pregnancy at 21 weeks 1 day gestation with fetal cardiac activity Breech presentation  Anterior placenta without evidence of previa Normal appearing fetal growth and amniotic fluid volume Pericardial effusion noted without skin thickening, pleural effusion, ascities, polyhydramnios or placental thickening No intracranial or hepatic calcifications noted No apparent birth defects noted on fetal anatomic survey although not all structures noted secondary to maternal habitus and fetal position No evidence of fetal anemia on MCA Dopplers -  Plan We have set her up for a pediatric echo We will draw NOB labs along with infectious sources to evaluate fetal pericardial effusion Follow-up ultrasound in ~2 weeks #2 Recent UTI - Treated with Nitrofurantin. Get TOC when antibiotic course complete and  consider prophylaxis during pregnancy or check monthly urine cultures #3 History of cesarean x3 - Counseling by primary obstetric provider - Did not discuss future pregnancy plans at this visit today #4 History of IUGR - Serial ultrasounds for evaluation of fetal growth - Consider daily low dose aspirin ( ), but suspect we are too far into the pregnancy for this to have an appreciable effect at this point of the pregnancy  Questions appear answered to her satisfaction. Precautions for the above given. Spent greater than 1/2 of 35 minute visit face to face counseling.

## 2014-12-19 LAB — CMV ANTIBODY, IGG (EIA): CMV Ab - IgG: >10 U/mL — ABNORMAL HIGH (ref 0.00–0.59)

## 2014-12-19 LAB — TOXOPLASMA ANTIBODIES- IGG AND  IGM
Toxoplasma Antibody- IgM: <3 AU/mL (ref 0.0–7.9)
Toxoplasma IgG Ratio: <3 IU/mL (ref 0.0–7.1)

## 2014-12-19 LAB — RUBELLA SCREEN: RUBELLA: 2.34 {index} (ref >0.99)

## 2014-12-19 LAB — VARICELLA ZOSTER ANTIBODY, IGG: Varicella IgG: <135 index — ABNORMAL LOW (ref >165)

## 2014-12-19 LAB — CMV IGM: CMV IgM: <30 AU/mL (ref 0.0–29.9)

## 2014-12-20 LAB — ADENOVIRUS ANTIBODIES: Adenovirus Antibody: 1:128 {titer} — ABNORMAL HIGH

## 2014-12-22 LAB — PARVOVIRUS B19 ANTIBODY, IGG AND IGM
PAROVIRUS B19 IGM ABS: 0.3 {index} (ref 0.0–0.8)
Parovirus B19 IgG Abs: 0.4 index (ref 0.0–0.8)

## 2014-12-23 LAB — HEMOGLOBINOPATHY EVALUATION
HGB A2 QUANT: 2.1 % (ref 0.7–3.1)
HGB A: 96.5 % (ref 94.0–98.0)
HGB C: 0 %
Hgb F Quant: 1.4 % (ref 0.0–2.0)
Hgb S Quant: 0 %

## 2014-12-29 ENCOUNTER — Other Ambulatory Visit (HOSPITAL_COMMUNITY): Payer: Self-pay | Admitting: Obstetrics and Gynecology

## 2014-12-29 DIAGNOSIS — O34219 Maternal care for unspecified type scar from previous cesarean delivery: Secondary | ICD-10-CM

## 2014-12-29 DIAGNOSIS — O99212 Obesity complicating pregnancy, second trimester: Secondary | ICD-10-CM

## 2014-12-29 DIAGNOSIS — O09299 Supervision of pregnancy with other poor reproductive or obstetric history, unspecified trimester: Secondary | ICD-10-CM

## 2014-12-30 LAB — COXSACKIE A VIRUS ANTIBODIES

## 2015-01-01 ENCOUNTER — Other Ambulatory Visit (HOSPITAL_COMMUNITY): Payer: Self-pay | Admitting: Obstetrics and Gynecology

## 2015-01-01 ENCOUNTER — Ambulatory Visit (HOSPITAL_COMMUNITY)
Admission: RE | Admit: 2015-01-01 | Discharge: 2015-01-01 | Disposition: A | Discharge status: Home / Self Care | Payer: Medicaid Other | Source: Ambulatory Visit | Attending: Obstetrics & Gynecology | Admitting: Obstetrics & Gynecology

## 2015-01-01 DIAGNOSIS — O365920 Maternal care for other known or suspected poor fetal growth, second trimester, not applicable or unspecified: Secondary | ICD-10-CM | POA: Diagnosis not present

## 2015-01-01 DIAGNOSIS — O99212 Obesity complicating pregnancy, second trimester: Secondary | ICD-10-CM | POA: Diagnosis not present

## 2015-01-01 DIAGNOSIS — O34219 Maternal care for unspecified type scar from previous cesarean delivery: Secondary | ICD-10-CM

## 2015-01-01 DIAGNOSIS — O09299 Supervision of pregnancy with other poor reproductive or obstetric history, unspecified trimester: Secondary | ICD-10-CM

## 2015-01-01 DIAGNOSIS — E669 Obesity, unspecified: Secondary | ICD-10-CM | POA: Diagnosis not present

## 2015-01-01 DIAGNOSIS — IMO0002 Reserved for concepts with insufficient information to code with codable children: Secondary | ICD-10-CM

## 2015-01-01 DIAGNOSIS — O3421 Maternal care for scar from previous cesarean delivery: Secondary | ICD-10-CM | POA: Insufficient documentation

## 2015-01-06 ENCOUNTER — Other Ambulatory Visit (HOSPITAL_COMMUNITY): Payer: Self-pay | Admitting: Obstetrics and Gynecology

## 2015-01-06 ENCOUNTER — Encounter (HOSPITAL_COMMUNITY): Payer: Self-pay | Admitting: Obstetrics and Gynecology

## 2015-01-15 ENCOUNTER — Other Ambulatory Visit (HOSPITAL_COMMUNITY): Payer: Self-pay | Admitting: Obstetrics and Gynecology

## 2015-01-15 ENCOUNTER — Encounter (HOSPITAL_COMMUNITY): Payer: Self-pay | Admitting: Obstetrics & Gynecology

## 2015-01-15 ENCOUNTER — Ambulatory Visit (HOSPITAL_COMMUNITY)
Admission: RE | Admit: 2015-01-15 | Discharge: 2015-01-15 | Disposition: A | Discharge status: Home / Self Care | Payer: Medicaid Other | Source: Ambulatory Visit | Attending: Obstetrics & Gynecology | Admitting: Obstetrics & Gynecology

## 2015-01-15 DIAGNOSIS — IMO0002 Reserved for concepts with insufficient information to code with codable children: Secondary | ICD-10-CM

## 2015-01-15 DIAGNOSIS — E669 Obesity, unspecified: Secondary | ICD-10-CM | POA: Insufficient documentation

## 2015-01-15 DIAGNOSIS — O99212 Obesity complicating pregnancy, second trimester: Secondary | ICD-10-CM

## 2015-01-15 DIAGNOSIS — O3421 Maternal care for scar from previous cesarean delivery: Secondary | ICD-10-CM | POA: Diagnosis not present

## 2015-01-15 DIAGNOSIS — Z3A23 23 weeks gestation of pregnancy: Secondary | ICD-10-CM

## 2015-01-15 DIAGNOSIS — O09292 Supervision of pregnancy with other poor reproductive or obstetric history, second trimester: Secondary | ICD-10-CM

## 2015-01-15 DIAGNOSIS — O34219 Maternal care for unspecified type scar from previous cesarean delivery: Secondary | ICD-10-CM

## 2015-01-15 DIAGNOSIS — O09299 Supervision of pregnancy with other poor reproductive or obstetric history, unspecified trimester: Secondary | ICD-10-CM

## 2015-01-30 ENCOUNTER — Encounter (HOSPITAL_COMMUNITY): Payer: Self-pay

## 2015-01-30 ENCOUNTER — Ambulatory Visit (HOSPITAL_COMMUNITY)
Admission: RE | Admit: 2015-01-30 | Discharge: 2015-01-30 | Disposition: A | Discharge status: Home / Self Care | Payer: Medicaid Other | Source: Ambulatory Visit | Attending: Obstetrics & Gynecology | Admitting: Obstetrics & Gynecology

## 2015-01-30 ENCOUNTER — Other Ambulatory Visit (HOSPITAL_COMMUNITY): Payer: Self-pay | Admitting: Maternal and Fetal Medicine

## 2015-01-30 DIAGNOSIS — O09292 Supervision of pregnancy with other poor reproductive or obstetric history, second trimester: Secondary | ICD-10-CM | POA: Diagnosis not present

## 2015-01-30 DIAGNOSIS — E669 Obesity, unspecified: Secondary | ICD-10-CM | POA: Insufficient documentation

## 2015-01-30 DIAGNOSIS — Z3A25 25 weeks gestation of pregnancy: Secondary | ICD-10-CM | POA: Insufficient documentation

## 2015-01-30 DIAGNOSIS — O3421 Maternal care for scar from previous cesarean delivery: Secondary | ICD-10-CM | POA: Diagnosis not present

## 2015-01-30 DIAGNOSIS — O368990 Maternal care for other specified fetal problems, unspecified trimester, not applicable or unspecified: Secondary | ICD-10-CM | POA: Insufficient documentation

## 2015-01-30 DIAGNOSIS — IMO0002 Reserved for concepts with insufficient information to code with codable children: Secondary | ICD-10-CM

## 2015-01-30 DIAGNOSIS — O34219 Maternal care for unspecified type scar from previous cesarean delivery: Secondary | ICD-10-CM

## 2015-01-30 DIAGNOSIS — O99212 Obesity complicating pregnancy, second trimester: Secondary | ICD-10-CM

## 2015-02-13 ENCOUNTER — Encounter (HOSPITAL_COMMUNITY): Payer: Self-pay

## 2015-02-13 ENCOUNTER — Other Ambulatory Visit (HOSPITAL_COMMUNITY): Payer: Self-pay | Admitting: Maternal and Fetal Medicine

## 2015-02-13 ENCOUNTER — Ambulatory Visit (HOSPITAL_COMMUNITY)
Admission: RE | Admit: 2015-02-13 | Discharge: 2015-02-13 | Disposition: A | Discharge status: Home / Self Care | Payer: Medicaid Other | Source: Ambulatory Visit | Attending: Obstetrics & Gynecology | Admitting: Obstetrics & Gynecology

## 2015-02-13 DIAGNOSIS — O09293 Supervision of pregnancy with other poor reproductive or obstetric history, third trimester: Secondary | ICD-10-CM | POA: Insufficient documentation

## 2015-02-13 DIAGNOSIS — O34219 Maternal care for unspecified type scar from previous cesarean delivery: Secondary | ICD-10-CM

## 2015-02-13 DIAGNOSIS — O3421 Maternal care for scar from previous cesarean delivery: Secondary | ICD-10-CM | POA: Diagnosis not present

## 2015-02-13 DIAGNOSIS — O99210 Obesity complicating pregnancy, unspecified trimester: Secondary | ICD-10-CM

## 2015-02-13 DIAGNOSIS — Z3A29 29 weeks gestation of pregnancy: Secondary | ICD-10-CM | POA: Diagnosis not present

## 2015-02-13 DIAGNOSIS — IMO0002 Reserved for concepts with insufficient information to code with codable children: Secondary | ICD-10-CM

## 2015-02-25 ENCOUNTER — Ambulatory Visit (HOSPITAL_COMMUNITY)
Admission: RE | Admit: 2015-02-25 | Discharge: 2015-02-25 | Disposition: A | Discharge status: Home / Self Care | Payer: Medicaid Other | Source: Ambulatory Visit | Attending: Obstetrics & Gynecology | Admitting: Obstetrics & Gynecology

## 2015-02-25 VITALS — BP 114/71 | HR 88 | Wt 343.2 lb

## 2015-02-25 DIAGNOSIS — O368990 Maternal care for other specified fetal problems, unspecified trimester, not applicable or unspecified: Secondary | ICD-10-CM | POA: Insufficient documentation

## 2015-02-25 DIAGNOSIS — R9389 Abnormal findings on diagnostic imaging of other specified body structures: Secondary | ICD-10-CM

## 2015-02-25 DIAGNOSIS — IMO0002 Reserved for concepts with insufficient information to code with codable children: Secondary | ICD-10-CM

## 2015-02-25 DIAGNOSIS — O284 Abnormal radiological finding on antenatal screening of mother: Secondary | ICD-10-CM | POA: Insufficient documentation

## 2015-03-20 ENCOUNTER — Other Ambulatory Visit (HOSPITAL_COMMUNITY): Payer: Self-pay | Admitting: Obstetrics and Gynecology

## 2015-03-20 ENCOUNTER — Ambulatory Visit (HOSPITAL_COMMUNITY)
Admission: RE | Admit: 2015-03-20 | Discharge: 2015-03-20 | Disposition: A | Discharge status: Home / Self Care | Payer: Medicaid Other | Source: Ambulatory Visit | Attending: Obstetrics & Gynecology | Admitting: Obstetrics & Gynecology

## 2015-03-20 ENCOUNTER — Encounter (HOSPITAL_COMMUNITY): Payer: Self-pay

## 2015-03-20 DIAGNOSIS — O34211 Maternal care for low transverse scar from previous cesarean delivery: Secondary | ICD-10-CM | POA: Diagnosis not present

## 2015-03-20 DIAGNOSIS — O99213 Obesity complicating pregnancy, third trimester: Secondary | ICD-10-CM

## 2015-03-20 DIAGNOSIS — Z3A34 34 weeks gestation of pregnancy: Secondary | ICD-10-CM | POA: Diagnosis not present

## 2015-03-20 DIAGNOSIS — E669 Obesity, unspecified: Secondary | ICD-10-CM | POA: Insufficient documentation

## 2015-03-20 DIAGNOSIS — R9389 Abnormal findings on diagnostic imaging of other specified body structures: Secondary | ICD-10-CM

## 2015-03-20 DIAGNOSIS — IMO0002 Reserved for concepts with insufficient information to code with codable children: Secondary | ICD-10-CM

## 2015-04-10 ENCOUNTER — Other Ambulatory Visit (HOSPITAL_COMMUNITY): Payer: Self-pay | Admitting: Maternal and Fetal Medicine

## 2015-04-10 ENCOUNTER — Ambulatory Visit (HOSPITAL_COMMUNITY)
Admission: RE | Admit: 2015-04-10 | Discharge: 2015-04-10 | Disposition: A | Discharge status: Home / Self Care | Payer: Medicaid Other | Source: Ambulatory Visit | Attending: Obstetrics & Gynecology | Admitting: Obstetrics & Gynecology

## 2015-04-10 VITALS — BP 117/67 | HR 85 | Wt 344.7 lb

## 2015-04-10 DIAGNOSIS — Z3A37 37 weeks gestation of pregnancy: Secondary | ICD-10-CM

## 2015-04-10 DIAGNOSIS — O368130 Decreased fetal movements, third trimester, not applicable or unspecified: Secondary | ICD-10-CM

## 2015-04-10 DIAGNOSIS — O99213 Obesity complicating pregnancy, third trimester: Secondary | ICD-10-CM

## 2015-04-10 DIAGNOSIS — IMO0002 Reserved for concepts with insufficient information to code with codable children: Secondary | ICD-10-CM

## 2015-04-10 DIAGNOSIS — E669 Obesity, unspecified: Secondary | ICD-10-CM | POA: Insufficient documentation

## 2015-04-10 DIAGNOSIS — O365930 Maternal care for other known or suspected poor fetal growth, third trimester, not applicable or unspecified: Secondary | ICD-10-CM | POA: Diagnosis not present

## 2015-04-10 DIAGNOSIS — O09293 Supervision of pregnancy with other poor reproductive or obstetric history, third trimester: Secondary | ICD-10-CM

## 2015-04-10 DIAGNOSIS — O34219 Maternal care for unspecified type scar from previous cesarean delivery: Secondary | ICD-10-CM

## 2015-04-10 DIAGNOSIS — O368990 Maternal care for other specified fetal problems, unspecified trimester, not applicable or unspecified: Secondary | ICD-10-CM | POA: Insufficient documentation

## 2015-04-10 DIAGNOSIS — Z3A27 27 weeks gestation of pregnancy: Secondary | ICD-10-CM | POA: Insufficient documentation

## 2015-04-16 ENCOUNTER — Inpatient Hospital Stay (HOSPITAL_COMMUNITY): Payer: Medicaid Other | Admitting: Anesthesiology

## 2015-04-16 ENCOUNTER — Ambulatory Visit (HOSPITAL_COMMUNITY)
Admission: RE | Admit: 2015-04-16 | Discharge: 2015-04-16 | Disposition: A | Discharge status: Home / Self Care | Payer: Medicaid Other | Source: Ambulatory Visit | Attending: Maternal and Fetal Medicine | Admitting: Maternal and Fetal Medicine

## 2015-04-16 ENCOUNTER — Other Ambulatory Visit (HOSPITAL_COMMUNITY): Payer: Self-pay | Admitting: Obstetrics and Gynecology

## 2015-04-16 ENCOUNTER — Encounter (HOSPITAL_COMMUNITY)
Admission: AD | Disposition: A | Discharge status: Home / Self Care | Payer: Self-pay | Source: Ambulatory Visit | Attending: Obstetrics and Gynecology

## 2015-04-16 ENCOUNTER — Ambulatory Visit (HOSPITAL_COMMUNITY): Payer: Medicaid Other

## 2015-04-16 ENCOUNTER — Inpatient Hospital Stay (HOSPITAL_COMMUNITY)
Admission: AD | Admit: 2015-04-16 | Discharge: 2015-04-19 | DRG: 765 | Disposition: A | Discharge status: Home / Self Care | Payer: Medicaid Other | Source: Ambulatory Visit | Attending: Obstetrics and Gynecology | Admitting: Obstetrics and Gynecology

## 2015-04-16 ENCOUNTER — Encounter (HOSPITAL_COMMUNITY): Payer: Self-pay | Admitting: Anesthesiology

## 2015-04-16 ENCOUNTER — Encounter (HOSPITAL_COMMUNITY): Payer: Self-pay | Admitting: *Deleted

## 2015-04-16 DIAGNOSIS — Z3A38 38 weeks gestation of pregnancy: Secondary | ICD-10-CM | POA: Diagnosis not present

## 2015-04-16 DIAGNOSIS — O99824 Streptococcus B carrier state complicating childbirth: Secondary | ICD-10-CM | POA: Diagnosis present

## 2015-04-16 DIAGNOSIS — O365911 Maternal care for other known or suspected poor fetal growth, first trimester, fetus 1: Secondary | ICD-10-CM

## 2015-04-16 DIAGNOSIS — O365930 Maternal care for other known or suspected poor fetal growth, third trimester, not applicable or unspecified: Secondary | ICD-10-CM

## 2015-04-16 DIAGNOSIS — O34211 Maternal care for low transverse scar from previous cesarean delivery: Principal | ICD-10-CM | POA: Diagnosis present

## 2015-04-16 DIAGNOSIS — O4292 Full-term premature rupture of membranes, unspecified as to length of time between rupture and onset of labor: Secondary | ICD-10-CM | POA: Diagnosis present

## 2015-04-16 DIAGNOSIS — Z6841 Body Mass Index (BMI) 40.0 and over, adult: Secondary | ICD-10-CM

## 2015-04-16 DIAGNOSIS — R208 Other disturbances of skin sensation: Secondary | ICD-10-CM | POA: Diagnosis not present

## 2015-04-16 DIAGNOSIS — O365990 Maternal care for other known or suspected poor fetal growth, unspecified trimester, not applicable or unspecified: Secondary | ICD-10-CM | POA: Diagnosis present

## 2015-04-16 DIAGNOSIS — G8918 Other acute postprocedural pain: Secondary | ICD-10-CM | POA: Diagnosis present

## 2015-04-16 DIAGNOSIS — O99213 Obesity complicating pregnancy, third trimester: Secondary | ICD-10-CM

## 2015-04-16 DIAGNOSIS — O99214 Obesity complicating childbirth: Secondary | ICD-10-CM | POA: Diagnosis present

## 2015-04-16 DIAGNOSIS — O34219 Maternal care for unspecified type scar from previous cesarean delivery: Secondary | ICD-10-CM

## 2015-04-16 DIAGNOSIS — Z4801 Encounter for change or removal of surgical wound dressing: Secondary | ICD-10-CM | POA: Diagnosis not present

## 2015-04-16 DIAGNOSIS — Z3490 Encounter for supervision of normal pregnancy, unspecified, unspecified trimester: Secondary | ICD-10-CM

## 2015-04-16 DIAGNOSIS — O9089 Other complications of the puerperium, not elsewhere classified: Secondary | ICD-10-CM | POA: Diagnosis not present

## 2015-04-16 LAB — CBC
HEMATOCRIT: 31.5 % — AB (ref 36.0–46.0)
HEMOGLOBIN: 10.2 g/dL — AB (ref 12.0–15.0)
MCH: 26.4 pg (ref 26.0–34.0)
MCHC: 32.4 g/dL (ref 30.0–36.0)
MCV: 81.4 fL (ref 78.0–100.0)
Platelets: 203 10*3/uL (ref 150–400)
RBC: 3.87 MIL/uL (ref 3.87–5.11)
RDW: 15.7 % — AB (ref 11.5–15.5)
WBC: 7.9 10*3/uL (ref 4.0–10.5)

## 2015-04-16 LAB — PREPARE RBC (CROSSMATCH)

## 2015-04-16 SURGERY — Surgical Case
Anesthesia: Regional

## 2015-04-16 MED ORDER — KETOROLAC TROMETHAMINE 30 MG/ML IJ SOLN
INTRAMUSCULAR | Status: AC
  Administered 2015-04-16: 30 mg
  Filled 2015-04-16: qty 1

## 2015-04-16 MED ORDER — FENTANYL CITRATE (PF) 100 MCG/2ML IJ SOLN: 25.0000 ug | INTRAMUSCULAR | Status: DC | PRN

## 2015-04-16 MED ORDER — OXYTOCIN 10 UNIT/ML IJ SOLN
INTRAMUSCULAR | Status: AC
  Filled 2015-04-16: qty 4

## 2015-04-16 MED ORDER — WITCH HAZEL-GLYCERIN EX PADS
1.0000 | MEDICATED_PAD | CUTANEOUS | Status: DC | PRN
Start: 2015-04-16 — End: 2015-04-17

## 2015-04-16 MED ORDER — FAMOTIDINE IN NACL 20-0.9 MG/50ML-% IV SOLN
20.0000 mg | Freq: Once | INTRAVENOUS | Status: AC
  Administered 2015-04-16: 20 mg via INTRAVENOUS
  Filled 2015-04-16: qty 50

## 2015-04-16 MED ORDER — BUPIVACAINE IN DEXTROSE 0.75-8.25 % IT SOLN
INTRATHECAL | Status: AC
  Filled 2015-04-16: qty 2

## 2015-04-16 MED ORDER — PHENYLEPHRINE HCL 10 MG/ML IJ SOLN
INTRAMUSCULAR | Status: DC | PRN
  Administered 2015-04-16 (×2): 80 ug via INTRAVENOUS
  Administered 2015-04-16: 40 ug via INTRAVENOUS
  Administered 2015-04-16 (×2): 80 ug via INTRAVENOUS
  Administered 2015-04-16: 40 ug via INTRAVENOUS

## 2015-04-16 MED ORDER — ONDANSETRON HCL 4 MG/2ML IJ SOLN
INTRAMUSCULAR | Status: DC | PRN
  Administered 2015-04-16: 4 mg via INTRAVENOUS

## 2015-04-16 MED ORDER — DIBUCAINE 1 % RE OINT: 1.0000 "application " | TOPICAL_OINTMENT | RECTAL | Status: DC | PRN

## 2015-04-16 MED ORDER — NALOXONE HCL 2 MG/2ML IJ SOSY: 1.0000 ug/kg/h | PREFILLED_SYRINGE | INTRAVENOUS | Status: DC | PRN

## 2015-04-16 MED ORDER — EPHEDRINE SULFATE 50 MG/ML IJ SOLN
INTRAMUSCULAR | Status: DC | PRN
  Administered 2015-04-16 (×2): 10 mg via INTRAVENOUS

## 2015-04-16 MED ORDER — BUPIVACAINE IN DEXTROSE 0.75-8.25 % IT SOLN
INTRATHECAL | Status: DC | PRN
  Administered 2015-04-16: 1.6 mL via INTRATHECAL

## 2015-04-16 MED ORDER — PHENYLEPHRINE 40 MCG/ML (10ML) SYRINGE FOR IV PUSH (FOR BLOOD PRESSURE SUPPORT)
PREFILLED_SYRINGE | INTRAVENOUS | Status: AC
  Filled 2015-04-16: qty 30

## 2015-04-16 MED ORDER — OXYCODONE-ACETAMINOPHEN 5-325 MG PO TABS: 1.0000 | ORAL_TABLET | ORAL | Status: DC | PRN

## 2015-04-16 MED ORDER — CEFAZOLIN SODIUM-DEXTROSE 2-3 GM-% IV SOLR: 2.0000 g | INTRAVENOUS | Status: DC

## 2015-04-16 MED ORDER — ONDANSETRON HCL 4 MG/2ML IJ SOLN: 4.0000 mg | Freq: Once | INTRAMUSCULAR | Status: DC | PRN

## 2015-04-16 MED ORDER — NALBUPHINE HCL 10 MG/ML IJ SOLN: 5.0000 mg | Freq: Once | INTRAMUSCULAR | Status: DC | PRN

## 2015-04-16 MED ORDER — MORPHINE SULFATE (PF) 0.5 MG/ML IJ SOLN
INTRAMUSCULAR | Status: AC
  Filled 2015-04-16: qty 10

## 2015-04-16 MED ORDER — NALOXONE HCL 0.4 MG/ML IJ SOLN: 0.4000 mg | INTRAMUSCULAR | Status: DC | PRN

## 2015-04-16 MED ORDER — DIPHENHYDRAMINE HCL 25 MG PO CAPS
25.0000 mg | ORAL_CAPSULE | ORAL | Status: DC | PRN
  Filled 2015-04-16: qty 1

## 2015-04-16 MED ORDER — MEPERIDINE HCL 25 MG/ML IJ SOLN: 6.2500 mg | INTRAMUSCULAR | Status: DC | PRN

## 2015-04-16 MED ORDER — PHENYLEPHRINE 8 MG IN D5W 100 ML (0.08MG/ML) PREMIX OPTIME
INJECTION | INTRAVENOUS | Status: AC
  Filled 2015-04-16: qty 100

## 2015-04-16 MED ORDER — DIPHENHYDRAMINE HCL 50 MG/ML IJ SOLN
INTRAMUSCULAR | Status: AC
  Filled 2015-04-16: qty 1

## 2015-04-16 MED ORDER — LACTATED RINGERS IV SOLN
INTRAVENOUS | Status: DC
  Administered 2015-04-17: 03:00:00 via INTRAVENOUS

## 2015-04-16 MED ORDER — NALBUPHINE HCL 10 MG/ML IJ SOLN: 5.0000 mg | INTRAMUSCULAR | Status: DC | PRN

## 2015-04-16 MED ORDER — OXYCODONE-ACETAMINOPHEN 5-325 MG PO TABS: 2.0000 | ORAL_TABLET | ORAL | Status: DC | PRN

## 2015-04-16 MED ORDER — SODIUM CHLORIDE 0.9 % IJ SOLN: 3.0000 mL | INTRAMUSCULAR | Status: DC | PRN

## 2015-04-16 MED ORDER — KETOROLAC TROMETHAMINE 30 MG/ML IJ SOLN: 30.0000 mg | Freq: Four times a day (QID) | INTRAMUSCULAR | Status: DC | PRN

## 2015-04-16 MED ORDER — DIPHENHYDRAMINE HCL 50 MG/ML IJ SOLN
12.5000 mg | INTRAMUSCULAR | Status: DC | PRN
  Administered 2015-04-16: 12.5 mg via INTRAVENOUS

## 2015-04-16 MED ORDER — ACETAMINOPHEN 500 MG PO TABS
1000.0000 mg | ORAL_TABLET | Freq: Four times a day (QID) | ORAL | Status: DC
  Administered 2015-04-17 (×2): 1000 mg via ORAL
  Filled 2015-04-16 (×2): qty 2

## 2015-04-16 MED ORDER — LACTATED RINGERS IV SOLN
INTRAVENOUS | Status: DC
  Administered 2015-04-16 (×3): via INTRAVENOUS

## 2015-04-16 MED ORDER — SENNOSIDES-DOCUSATE SODIUM 8.6-50 MG PO TABS
2.0000 | ORAL_TABLET | ORAL | Status: DC
  Administered 2015-04-17 (×2): 2 via ORAL
  Filled 2015-04-16: qty 2

## 2015-04-16 MED ORDER — MORPHINE SULFATE (PF) 0.5 MG/ML IJ SOLN
INTRAMUSCULAR | Status: DC | PRN
  Administered 2015-04-16: .2 mg via INTRATHECAL

## 2015-04-16 MED ORDER — SCOPOLAMINE 1 MG/3DAYS TD PT72: 1.0000 | MEDICATED_PATCH | Freq: Once | TRANSDERMAL | Status: DC

## 2015-04-16 MED ORDER — EPHEDRINE 5 MG/ML INJ
INTRAVENOUS | Status: AC
  Filled 2015-04-16: qty 10

## 2015-04-16 MED ORDER — LACTATED RINGERS IV BOLUS (SEPSIS)
1000.0000 mL | Freq: Once | INTRAVENOUS | Status: AC
  Administered 2015-04-16: 1000 mL via INTRAVENOUS

## 2015-04-16 MED ORDER — OXYTOCIN 40 UNITS IN LACTATED RINGERS INFUSION - SIMPLE MED: 62.5000 mL/h | INTRAVENOUS | Status: AC

## 2015-04-16 MED ORDER — DEXTROSE 5 % IV SOLN
3.0000 g | INTRAVENOUS | Status: AC
  Administered 2015-04-16: 3 g via INTRAVENOUS
  Filled 2015-04-16: qty 3000

## 2015-04-16 MED ORDER — TETANUS-DIPHTH-ACELL PERTUSSIS 5-2.5-18.5 LF-MCG/0.5 IM SUSP
0.5000 mL | Freq: Once | INTRAMUSCULAR | Status: DC
  Filled 2015-04-16: qty 0.5

## 2015-04-16 MED ORDER — DIPHENHYDRAMINE HCL 50 MG/ML IJ SOLN: 12.5000 mg | INTRAMUSCULAR | Status: DC | PRN

## 2015-04-16 MED ORDER — PRENATAL MULTIVITAMIN CH: 1.0000 | ORAL_TABLET | Freq: Every day | ORAL | Status: DC

## 2015-04-16 MED ORDER — MEASLES, MUMPS & RUBELLA VAC ~~LOC~~ INJ
0.5000 mL | INJECTION | Freq: Once | SUBCUTANEOUS | Status: DC
  Filled 2015-04-16: qty 0.5

## 2015-04-16 MED ORDER — PHENYLEPHRINE 8 MG IN D5W 100 ML (0.08MG/ML) PREMIX OPTIME
INJECTION | INTRAVENOUS | Status: DC | PRN
  Administered 2015-04-16: 60 ug/min via INTRAVENOUS

## 2015-04-16 MED ORDER — ACETAMINOPHEN 325 MG PO TABS
650.0000 mg | ORAL_TABLET | ORAL | Status: DC | PRN
  Filled 2015-04-16 (×2): qty 2

## 2015-04-16 MED ORDER — ZOLPIDEM TARTRATE 5 MG PO TABS: 5.0000 mg | ORAL_TABLET | Freq: Every evening | ORAL | Status: DC | PRN

## 2015-04-16 MED ORDER — SIMETHICONE 80 MG PO CHEW
80.0000 mg | CHEWABLE_TABLET | ORAL | Status: DC
  Administered 2015-04-17 – 2015-04-18 (×3): 80 mg via ORAL
  Filled 2015-04-16 (×3): qty 1

## 2015-04-16 MED ORDER — OXYTOCIN 10 UNIT/ML IJ SOLN
40.0000 [IU] | INTRAVENOUS | Status: DC | PRN
  Administered 2015-04-16: 40 [IU] via INTRAVENOUS

## 2015-04-16 MED ORDER — CITRIC ACID-SODIUM CITRATE 334-500 MG/5ML PO SOLN
30.0000 mL | Freq: Once | ORAL | Status: AC
  Administered 2015-04-16: 30 mL via ORAL
  Filled 2015-04-16: qty 15

## 2015-04-16 MED ORDER — FENTANYL CITRATE (PF) 100 MCG/2ML IJ SOLN
INTRAMUSCULAR | Status: DC | PRN
  Administered 2015-04-16: 10 ug via INTRATHECAL

## 2015-04-16 MED ORDER — FENTANYL CITRATE (PF) 100 MCG/2ML IJ SOLN
INTRAMUSCULAR | Status: AC
  Filled 2015-04-16: qty 2

## 2015-04-16 MED ORDER — ACETAMINOPHEN 500 MG PO TABS: 1000.0000 mg | ORAL_TABLET | Freq: Four times a day (QID) | ORAL | Status: DC

## 2015-04-16 MED ORDER — ONDANSETRON HCL 4 MG/2ML IJ SOLN
INTRAMUSCULAR | Status: AC
  Filled 2015-04-16: qty 2

## 2015-04-16 MED ORDER — ONDANSETRON HCL 4 MG/2ML IJ SOLN: 4.0000 mg | Freq: Three times a day (TID) | INTRAMUSCULAR | Status: DC | PRN

## 2015-04-16 MED ORDER — LACTATED RINGERS IV SOLN
INTRAVENOUS | Status: DC | PRN
  Administered 2015-04-16: 16:00:00 via INTRAVENOUS

## 2015-04-16 MED ORDER — NALOXONE HCL 2 MG/2ML IJ SOSY
1.0000 ug/kg/h | PREFILLED_SYRINGE | INTRAVENOUS | Status: DC | PRN
  Filled 2015-04-16: qty 2

## 2015-04-16 MED ORDER — IBUPROFEN 600 MG PO TABS
600.0000 mg | ORAL_TABLET | Freq: Four times a day (QID) | ORAL | Status: DC
  Administered 2015-04-17 – 2015-04-19 (×3): 600 mg via ORAL
  Filled 2015-04-16 (×10): qty 1

## 2015-04-16 SURGICAL SUPPLY — 33 items
BENZOIN TINCTURE PRP APPL 2/3 (GAUZE/BANDAGES/DRESSINGS) ×3 IMPLANT
BINDER ABDOMINAL 12 ML 46-62 (SOFTGOODS) ×3 IMPLANT
CLAMP CORD UMBIL (MISCELLANEOUS) IMPLANT
CLOSURE WOUND 1/2 X4 (GAUZE/BANDAGES/DRESSINGS) ×1
CLOTH BEACON ORANGE TIMEOUT ST (SAFETY) ×3 IMPLANT
CONTAINER PREFILL 10% NBF 15ML (MISCELLANEOUS) IMPLANT
DRAPE SHEET LG 3/4 BI-LAMINATE (DRAPES) IMPLANT
DRESSING DISP NPWT PICO 4X12 (MISCELLANEOUS) ×3 IMPLANT
DRSG OPSITE POSTOP 4X10 (GAUZE/BANDAGES/DRESSINGS) ×3 IMPLANT
DURAPREP 26ML APPLICATOR (WOUND CARE) ×3 IMPLANT
ELECT REM PT RETURN 9FT ADLT (ELECTROSURGICAL) ×3
ELECTRODE REM PT RTRN 9FT ADLT (ELECTROSURGICAL) ×1 IMPLANT
EXTRACTOR VACUUM M CUP 4 TUBE (SUCTIONS) IMPLANT
EXTRACTOR VACUUM M CUP 4' TUBE (SUCTIONS)
GLOVE BIOGEL PI IND STRL 7.0 (GLOVE) ×1 IMPLANT
GLOVE BIOGEL PI INDICATOR 7.0 (GLOVE) ×2
GLOVE ECLIPSE 7.0 STRL STRAW (GLOVE) ×6 IMPLANT
GOWN STRL REUS W/TWL LRG LVL3 (GOWN DISPOSABLE) ×6 IMPLANT
KIT ABG SYR 3ML LUER SLIP (SYRINGE) IMPLANT
NEEDLE HYPO 25X5/8 SAFETYGLIDE (NEEDLE) IMPLANT
NS IRRIG 1000ML POUR BTL (IV SOLUTION) ×3 IMPLANT
PACK C SECTION WH (CUSTOM PROCEDURE TRAY) ×3 IMPLANT
PAD OB MATERNITY 4.3X12.25 (PERSONAL CARE ITEMS) ×3 IMPLANT
RTRCTR C-SECT PINK 25CM LRG (MISCELLANEOUS) IMPLANT
STRIP CLOSURE SKIN 1/2X4 (GAUZE/BANDAGES/DRESSINGS) ×2 IMPLANT
SUT MNCRL 0 VIOLET CTX 36 (SUTURE) ×5 IMPLANT
SUT MON AB 2-0 CT1 27 (SUTURE) ×6 IMPLANT
SUT MONOCRYL 0 CTX 36 (SUTURE) ×10
SUT PLAIN 0 NONE (SUTURE) IMPLANT
SUT PLAIN 2 0 XLH (SUTURE) ×3 IMPLANT
SUT VIC AB 4-0 KS 27 (SUTURE) ×3 IMPLANT
TOWEL OR 17X24 6PK STRL BLUE (TOWEL DISPOSABLE) ×3 IMPLANT
TRAY FOLEY CATH SILVER 14FR (SET/KITS/TRAYS/PACK) IMPLANT

## 2015-04-16 NOTE — Anesthesia Procedure Notes (Signed)
Epidural Patient location during procedure: OR  Staffing Anesthesiologist: Elzie Knisley Performed by: anesthesiologist   Preanesthetic Checklist Completed: patient identified, site marked, surgical consent, pre-op evaluation, timeout performed, IV checked, risks and benefits discussed and monitors and equipment checked  Epidural Patient position: sitting Prep: site prepped and draped and DuraPrep Patient monitoring: continuous pulse ox and blood pressure Approach: midline Location: L3-L4 Injection technique: LOR saline  Needle:  Needle type: Tuohy  Needle gauge: 17 G Needle length: 9 cm and 9 Needle insertion depth: 9 cm Catheter type: closed end flexible Catheter size: 19 Gauge Catheter at skin depth: 15 cm Test dose: negative  Assessment Events: blood not aspirated, injection not painful, no injection resistance, negative IV test and no paresthesia  Additional Notes Patient identified. Risks/Benefits/Options discussed with patient including but not limited to bleeding, infection, nerve damage, paralysis, failed block, incomplete pain control, headache, blood pressure changes, nausea, vomiting, reactions to medication both or allergic, itching and postpartum back pain. Confirmed with bedside nurse the patient's most recent platelet count. Confirmed with patient that they are not currently taking any anticoagulation, have any bleeding history or any family history of bleeding disorders. Patient expressed understanding and wished to proceed. All questions were answered. Sterile technique was used throughout the entire procedure. Please see nursing notes for vital signs.  This was a CSE. The tuohy was inserted and easily got LOR with saline. The 27ga sprotte needle was then passed and clear CSF return with no heme or paresthesia. Spinal dose injected then sprotte withdrawn and catheter easily passed. Tuohy removed. Dressing applied.

## 2015-04-16 NOTE — ED Notes (Signed)
Report called to K. Andrey CampanileWilson, RN.

## 2015-04-16 NOTE — Op Note (Signed)
Christine Weber, Christine Weber           ACCOUNT NO.:  [id number]  MEDICAL RECORD NO.:  [id number]  LOCATION:  9130                          FACILITY:  [facility]  PHYSICIAN:  Malva Limes, M.D.    DATE OF BIRTH:  08-27-1980  DATE OF PROCEDURE: DATE OF DISCHARGE:                              OPERATIVE REPORT   PREOPERATIVE DIAGNOSES: 1. Intrauterine pregnancy at term. 2. History of prior cesarean section x3. 3. Intrauterine growth restriction. 4. History of fetal pericardial effusion. 5. Positive group B strep. 6. Spontaneous rupture of membranes.  POSTOPERATIVE DIAGNOSES: 1. Intrauterine pregnancy at term. 2. History of prior cesarean section x3. 3. Intrauterine growth restriction. 4. History of fetal pericardial effusion. 5. Positive group B strep. 6. Spontaneous rupture of membranes.  PROCEDURE:  Repeat low-transverse cesarean section.  SURGEON:  Malva Limes, M.D.  ANESTHESIA:  Spinal.  ANTIBIOTICS:  Ancef 3 g.  DRAINS:  Foley to bedside drainage.  ESTIMATED BLOOD LOSS:  900 mL.  COMPLICATIONS:  None.  FINDINGS:  The patient had normal fallopian tubes and ovaries bilaterally.  There were only few omental adhesions.  The incision was intact.  The infant was delivered in the vertex presentation, did have a nuchal cord x1.  Amniotic fluid was noted to be clear.  DESCRIPTION OF PROCEDURE:  The patient was taken to the operating room, where a spinal anesthetic was administered without difficulty.  Once an adequate level was reached, she was prepped and draped in the usual fashion for this procedure.  A Pfannenstiel incision was made through the previous scar.  On entering the abdominal cavity, the bladder flap was taken down with sharp dissection.  A low-transverse uterine incision was then made in the midline with the Metzenbaum scissors.  Amniotic fluid was noted to be clear.  The incision was extended laterally with blunt dissection.  The infant was delivered in the  vertex presentation. On delivery of the head, the oropharynx and nostrils were bulb suctioned.  The remaining infant was then delivered.  The cord was doubly clamped and cut, and the infant handed to the awaiting NICU team. The cord blood was obtained.  The placenta was manually removed.  The uterus was wiped with a wet lap and examined.  The uterine incision was closed using a single layer of 0 Monocryl suture in a running, locking fashion.  The bladder flap was not repaired.  The incision was checked for hemostasis and was felt to be adequate.  The rectus muscles and parietal peritoneum were then closed using 2-0 Monocryl suture in a running fashion.  I should mention that the omental adhesions were taken down with the Bovie prior to exiting the abdominal cavity.  At this point, the fascia was closed using 0 Monocryl suture in a running fashion.  The subcuticular tissue was then closed using interrupted 2-0 plain gut suture.  The skin was closed using 3-0 Rapide in a subcuticular fashion.  The pigtail drain was then placed and a dressing placed.  She was taken to the recovery room in stable condition. Instrument and lap counts were correct x3.          ______________________________ Malva Limes, M.D.     MA/MEDQ  D:  04/16/2015  T:  04/16/2015  Job:  [id number]

## 2015-04-16 NOTE — ED Notes (Signed)
Pt to MAU 

## 2015-04-16 NOTE — H&P (Signed)
Pt is a 34 y/o black female, [id number] who was seen this am by MFM and found to have an AFI of 3. She has been followed for IUGR. She also reports SROM today. MFM recommended Repeat C/S today. She has had 3 previous c/ss. She is morbidly obese. PNC was also complicated by a pericardial effusion in the fetus. She has had several fetal echos and blood work for viral inf. No definitive etiology is known. GBS+ PMHX: see hollister PE: Morbidly obese        VSSAF        ABD gravid, non tender        FHTs - reactive. IMP/ IUP at 38 weeks         IUGR         SROM        Prior C/S x 3        Fetal pericardial effusion        +GBS PLAN/ Repeat C/S

## 2015-04-16 NOTE — Anesthesia Postprocedure Evaluation (Signed)
  Anesthesia Post-op Note  Patient: Christine Weber  Procedure(s) Performed: Procedure(s) (LRB): CESAREAN SECTION (N/A)  Patient Location: PACU  Anesthesia Type: Spinal  Level of Consciousness: awake and alert   Airway and Oxygen Therapy: Patient Spontanous Breathing  Post-op Pain: mild  Post-op Assessment: Post-op Vital signs reviewed, Patient's Cardiovascular Status Stable, Respiratory Function Stable, Patent Airway and No signs of Nausea or vomiting  Last Vitals:  Filed Vitals:   04/16/15 1700  BP: 106/50  Pulse: 71  Temp:   Resp: 20    Post-op Vital Signs: stable   Complications: No apparent anesthesia complications

## 2015-04-16 NOTE — Transfer of Care (Signed)
Immediate Anesthesia Transfer of Care Note  Patient: Christine Weber  Procedure(s) Performed: Procedure(s): CESAREAN SECTION (N/A)  Patient Location: PACU  Anesthesia Type:Spinal  Level of Consciousness: awake, alert  and oriented  Airway & Oxygen Therapy: Patient Spontanous Breathing  Post-op Assessment: Report given to RN and Post -op Vital signs reviewed and stable  Post vital signs: Reviewed and stable  Last Vitals:  Filed Vitals:   04/16/15 1208  BP:   Pulse: 114  Temp:   Resp:     Complications: No apparent anesthesia complications

## 2015-04-16 NOTE — Anesthesia Preprocedure Evaluation (Addendum)
Anesthesia Evaluation  Patient identified by MRN, date of birth, ID band Patient awake    Reviewed: Allergy & Precautions, NPO status , Patient's Chart, lab work & pertinent test results  History of Anesthesia Complications Negative for: history of anesthetic complications  Airway Mallampati: III  TM Distance: >3 FB Neck ROM: Full    Dental no notable dental hx. (+) Dental Advisory Given   Pulmonary neg pulmonary ROS,    Pulmonary exam normal breath sounds clear to auscultation       Cardiovascular negative cardio ROS Normal cardiovascular exam Rhythm:Regular Rate:Normal     Neuro/Psych PSYCHIATRIC DISORDERS Anxiety negative neurological ROS     GI/Hepatic negative GI ROS, Neg liver ROS,   Endo/Other  Morbid obesitySuper morbid obesity, BMI 60  Renal/GU negative Renal ROS  negative genitourinary   Musculoskeletal negative musculoskeletal ROS (+)   Abdominal   Peds negative pediatric ROS (+)  Hematology negative hematology ROS (+)   Anesthesia Other Findings   Reproductive/Obstetrics (+) Pregnancy Prior C/S x 3                             Anesthesia Physical Anesthesia Plan  ASA: III and emergent  Anesthesia Plan: Combined Spinal and Epidural   Post-op Pain Management:    Induction:   Airway Management Planned:   Additional Equipment:   Intra-op Plan:   Post-operative Plan:   Informed Consent: I have reviewed the patients History and Physical, chart, labs and discussed the procedure including the risks, benefits and alternatives for the proposed anesthesia with the patient or authorized representative who has indicated his/her understanding and acceptance.   Dental advisory given  Plan Discussed with:   Anesthesia Plan Comments: (Patient will be NPO at 5pm. She ate a chicken sandwhich at 0900. Instructed nursing staff to inform Dr. Dareen PianoAnderson that I will proceed with this  C/S at any time that he deems it to be clinically indicated but if the procedure can wait until 5pm that is when she will be appropriately NPO. He wishes to proceed at 3pm. We will perform a neuraxial anesthetic, and give both pepcid and sodium citrate to help decrease the risk of aspiration or problems associated with aspiration. She is a repeat C/S that is laboring. )       Anesthesia Quick Evaluation

## 2015-04-16 NOTE — MAU Note (Signed)
Pt here for add on C/S, ? SROM 1-2 days ago, oligo.  Denies uc's or bleeding.

## 2015-04-17 ENCOUNTER — Encounter (HOSPITAL_COMMUNITY): Payer: Self-pay | Admitting: Obstetrics and Gynecology

## 2015-04-17 LAB — CBC
HEMATOCRIT: 28.1 % — AB (ref 36.0–46.0)
Hemoglobin: 9 g/dL — ABNORMAL LOW (ref 12.0–15.0)
MCH: 26.6 pg (ref 26.0–34.0)
MCHC: 32 g/dL (ref 30.0–36.0)
MCV: 83.1 fL (ref 78.0–100.0)
Platelets: 212 10*3/uL (ref 150–400)
RBC: 3.38 MIL/uL — AB (ref 3.87–5.11)
RDW: 15.8 % — ABNORMAL HIGH (ref 11.5–15.5)
WBC: 8.5 10*3/uL (ref 4.0–10.5)

## 2015-04-17 LAB — RPR: RPR Ser Ql: NONREACTIVE

## 2015-04-17 MED ORDER — MEASLES, MUMPS & RUBELLA VAC ~~LOC~~ INJ
0.5000 mL | INJECTION | Freq: Once | SUBCUTANEOUS | Status: DC
  Filled 2015-04-17: qty 0.5

## 2015-04-17 MED ORDER — IBUPROFEN 600 MG PO TABS
600.0000 mg | ORAL_TABLET | Freq: Four times a day (QID) | ORAL | Status: DC
  Administered 2015-04-17 – 2015-04-19 (×9): 600 mg via ORAL
  Filled 2015-04-17 (×3): qty 1

## 2015-04-17 MED ORDER — DIBUCAINE 1 % RE OINT: 1.0000 "application " | TOPICAL_OINTMENT | RECTAL | Status: DC | PRN

## 2015-04-17 MED ORDER — OXYCODONE-ACETAMINOPHEN 5-325 MG PO TABS: 1.0000 | ORAL_TABLET | ORAL | Status: DC | PRN

## 2015-04-17 MED ORDER — OXYTOCIN 40 UNITS IN LACTATED RINGERS INFUSION - SIMPLE MED: 62.5000 mL/h | INTRAVENOUS | Status: AC

## 2015-04-17 MED ORDER — SIMETHICONE 80 MG PO CHEW
80.0000 mg | CHEWABLE_TABLET | ORAL | Status: DC
Start: 2015-04-17 — End: 2015-04-19
  Administered 2015-04-17 – 2015-04-19 (×4): 80 mg via ORAL
  Filled 2015-04-17 (×4): qty 1

## 2015-04-17 MED ORDER — WITCH HAZEL-GLYCERIN EX PADS: 1.0000 "application " | MEDICATED_PAD | CUTANEOUS | Status: DC | PRN

## 2015-04-17 MED ORDER — ZOLPIDEM TARTRATE 5 MG PO TABS: 5.0000 mg | ORAL_TABLET | Freq: Every evening | ORAL | Status: DC | PRN

## 2015-04-17 MED ORDER — PRENATAL MULTIVITAMIN CH
1.0000 | ORAL_TABLET | Freq: Every morning | ORAL | Status: DC
  Administered 2015-04-17 – 2015-04-19 (×3): 1 via ORAL
  Filled 2015-04-17 (×3): qty 1

## 2015-04-17 MED ORDER — PRENATAL MULTIVITAMIN CH: 1.0000 | ORAL_TABLET | Freq: Every day | ORAL | Status: DC

## 2015-04-17 MED ORDER — ACETAMINOPHEN 325 MG PO TABS
650.0000 mg | ORAL_TABLET | ORAL | Status: DC | PRN
  Administered 2015-04-18 – 2015-04-19 (×2): 650 mg via ORAL

## 2015-04-17 MED ORDER — OXYCODONE-ACETAMINOPHEN 5-325 MG PO TABS: 2.0000 | ORAL_TABLET | ORAL | Status: DC | PRN

## 2015-04-17 MED ORDER — LACTATED RINGERS IV SOLN
INTRAVENOUS | Status: DC
  Administered 2015-04-17: 11:00:00 via INTRAVENOUS

## 2015-04-17 MED ORDER — TETANUS-DIPHTH-ACELL PERTUSSIS 5-2.5-18.5 LF-MCG/0.5 IM SUSP: 0.5000 mL | Freq: Once | INTRAMUSCULAR | Status: DC

## 2015-04-17 MED ORDER — DIPHENHYDRAMINE HCL 25 MG PO CAPS
25.0000 mg | ORAL_CAPSULE | Freq: Four times a day (QID) | ORAL | Status: DC | PRN
  Administered 2015-04-17: 25 mg via ORAL
  Filled 2015-04-17: qty 1

## 2015-04-17 MED ORDER — SENNOSIDES-DOCUSATE SODIUM 8.6-50 MG PO TABS
2.0000 | ORAL_TABLET | ORAL | Status: DC
  Administered 2015-04-18: 2 via ORAL
  Filled 2015-04-17 (×3): qty 2

## 2015-04-17 NOTE — Progress Notes (Signed)
Patient is eating, ambulating, voiding.  Pain control is good.  Appropriate lochia, no complaints. + flatus  Filed Vitals:   04/16/15 2100 04/16/15 2200 04/17/15 0200 04/17/15 0600  BP: 96/58 107/64 108/69 108/62  Pulse: 69 75 80 76  Temp: 98.4 F (36.9 C) 97.8 F (36.6 C) 98.4 F (36.9 C) 97.7 F (36.5 C)  TempSrc: Oral Oral Oral Oral  Resp: 20 20 20 20   Height:      Weight:      SpO2: 96% 98% 99% 99%    Fundus firm Perineum without swelling. Inc: c/d/i with wound vac in place Ext: no CT  Lab Results  Component Value Date   WBC 8.5 04/17/2015   HGB 9.0* 04/17/2015   HCT 28.1* 04/17/2015   MCV 83.1 04/17/2015   PLT 212 04/17/2015    --/--/O POS (11/17 1227)  A/P Post op day #1 s/p 4th c/s Doing well, encourage ambulation Declines circ Continue current care   [place]bow LakeALLAHAN, Shadia Larose

## 2015-04-17 NOTE — Anesthesia Postprocedure Evaluation (Signed)
  Anesthesia Post-op Note  Patient: Christine Weber  Procedure(s) Performed: Procedure(s): CESAREAN SECTION (N/A)  Patient Location: Mother/Baby  Anesthesia Type:Epidural  Level of Consciousness: awake, alert  and oriented  Airway and Oxygen Therapy: Patient Spontanous Breathing  Post-op Pain: none  Post-op Assessment: Post-op Vital signs reviewed, Patient's Cardiovascular Status Stable, Respiratory Function Stable, Patent Airway, No signs of Nausea or vomiting, Adequate PO intake, Pain level controlled, No headache, No backache and Patient able to bend at knees              Post-op Vital Signs: Reviewed and stable  Last Vitals:  Filed Vitals:   04/17/15 0600  BP: 108/62  Pulse: 76  Temp: 36.5 C  Resp: 20    Complications: No apparent anesthesia complications

## 2015-04-17 NOTE — Addendum Note (Signed)
Addendum  created 04/17/15 0747 by Junious SilkMelinda Chriss Redel, CRNA   Modules edited: Notes Section   Notes Section:  File: [id number]

## 2015-04-18 MED ORDER — DOCUSATE SODIUM 100 MG PO CAPS: 100.0000 mg | ORAL_CAPSULE | Freq: Two times a day (BID) | ORAL | Status: DC

## 2015-04-18 MED ORDER — IBUPROFEN 600 MG PO TABS: 600.0000 mg | ORAL_TABLET | Freq: Four times a day (QID) | ORAL | Status: DC | PRN

## 2015-04-18 MED ORDER — OXYCODONE-ACETAMINOPHEN 5-325 MG PO TABS: 1.0000 | ORAL_TABLET | ORAL | Status: DC | PRN

## 2015-04-18 NOTE — Progress Notes (Signed)
Patient ID: [name], female   DOB: 11/18/1980, 34 y.o.   MRN: [id number]  Pt had wanted early discharge but has since changed her mind. DC order cancelled

## 2015-04-18 NOTE — Discharge Summary (Signed)
Obstetric Discharge Summary Reason for Admission: cesarean section Prenatal Procedures: NST and ultrasound Intrapartum Procedures: cesarean: low cervical, transverse Postpartum Procedures: none Complications-Operative and Postpartum: none HEMOGLOBIN  Date Value Ref Range Status  04/17/2015 9.0* 12.0 - 15.0 g/dL Final   HCT  Date Value Ref Range Status  04/17/2015 28.1* 36.0 - 46.0 % Final    Physical Exam:  General: alert, cooperative and appears stated age 68Lochia: appropriate Uterine Fundus: firm Incision: healing well DVT Evaluation: No evidence of DVT seen on physical exam.  Discharge Diagnoses: Term Pregnancy-delivered, IUGR  Discharge Information: Date: 04/18/2015 Activity: pelvic rest Diet: routine Medications: Ibuprofen, Colace and Percocet Condition: improved Instructions: refer to practice specific booklet Discharge to: home   Newborn Data: Live born female  Birth Weight: 5 lb 15.9 oz (2720 g) APGAR: 8, 9  Home with mother.  Christine Weber H. 04/18/2015, 11:09 AM

## 2015-04-19 ENCOUNTER — Inpatient Hospital Stay (HOSPITAL_COMMUNITY)
Admission: AD | Admit: 2015-04-19 | Discharge: 2015-04-20 | Disposition: A | Discharge status: Home / Self Care | Payer: Medicaid Other | Source: Ambulatory Visit | Attending: Obstetrics and Gynecology | Admitting: Obstetrics and Gynecology

## 2015-04-19 ENCOUNTER — Encounter (HOSPITAL_COMMUNITY): Payer: Self-pay | Admitting: *Deleted

## 2015-04-19 DIAGNOSIS — G8918 Other acute postprocedural pain: Secondary | ICD-10-CM | POA: Insufficient documentation

## 2015-04-19 DIAGNOSIS — Z4801 Encounter for change or removal of surgical wound dressing: Secondary | ICD-10-CM

## 2015-04-19 DIAGNOSIS — R208 Other disturbances of skin sensation: Secondary | ICD-10-CM

## 2015-04-19 DIAGNOSIS — O9089 Other complications of the puerperium, not elsewhere classified: Secondary | ICD-10-CM | POA: Insufficient documentation

## 2015-04-19 DIAGNOSIS — L7682 Other postprocedural complications of skin and subcutaneous tissue: Secondary | ICD-10-CM

## 2015-04-19 MED ORDER — IBUPROFEN 200 MG PO TABS: 200.0000 mg | ORAL_TABLET | Freq: Four times a day (QID) | ORAL | Status: DC | PRN

## 2015-04-19 NOTE — Lactation Note (Signed)
This note was copied from the chart of Christine Oralia ManisShaniqua Shutes. Lactation Consultation Note  Mother of baby wanted to give infant some colostrum so she put him to the breast last night.  She told the RN that she breast fed her last child and that she wants to formula feed this child. RN will notify IBCLC if mother wants a consult. Patient Name: Christine Weber WGNFA'OToday's Date: 04/19/2015     Maternal Data    Feeding    LATCH Score/Interventions                      Lactation Tools Discussed/Used     Consult Status      Christine Weber, Christine Weber 04/19/2015, 10:55 AM

## 2015-04-19 NOTE — Progress Notes (Signed)
RN called Dr. Tenny Crawoss to evaluate patient's dressing and that she may need to change dressing prior to discharge when rounding in am. Patient has a PICO pump.

## 2015-04-19 NOTE — Progress Notes (Signed)
POD#3 Pt doing well. She is ready for discharge. Incision healing well. VSSAF IMP/ stable  Plan/ discharge.

## 2015-04-19 NOTE — Discharge Summary (Signed)
Obstetric Discharge Summary Reason for Admission: cesarean section and rupture of membranes Prenatal Procedures: NST and ultrasound Intrapartum Procedures: cesarean: low cervical, transverse Postpartum Procedures: none Complications-Operative and Postpartum:none HEMOGLOBIN  Date Value Ref Range Status  04/17/2015 9.0* 12.0 - 15.0 g/dL Final   HCT  Date Value Ref Range Status  04/17/2015 28.1* 36.0 - 46.0 % Final    Physical Exam:  General: alert Lochia: appropriate Uterine Fundus: firm Incision: healing well DVT Evaluation: No evidence of DVT seen on physical exam.  Discharge Diagnoses: Term Pregnancy-delivered and PPROM and IUGR and history of prior C/S  Discharge Information: Date: 04/19/2015 Activity: pelvic rest Diet: routine Medications: PNV and Ibuprofen Condition: stable Instructions: refer to practice specific booklet Discharge to: home Follow-up Information    Follow up with Levi AlandANDERSON,Angy Swearengin E, MD On 04/20/2015.   Specialty:  Obstetrics and Gynecology   Why:  For wound re-check at 1:30 pm   Contact information:   [address] [place] [address] [phone]       Follow up with Levi AlandANDERSON,Aundreya Souffrant E, MD. Schedule an appointment as soon as possible for a visit in 1 month.   Specialty:  Obstetrics and Gynecology   Contact information:   [address] [place] [address] [phone]       Newborn Data: Live born female  Birth Weight: 5 lb 15.9 oz (2720 g) APGAR: 8, 9  Home with mother.  Elnore Cosens E 04/19/2015, 11:07 AM

## 2015-04-19 NOTE — Discharge Instructions (Signed)
Cesarean Delivery, Care After  Refer to this sheet in the next few weeks. These instructions provide you with information on caring for yourself after your procedure. Your health care provider may also give you specific instructions. Your treatment has been planned according to current medical practices, but problems sometimes occur. Call your health care provider if you have any problems or questions after you go home.  HOME CARE INSTRUCTIONS   Only take over-the-counter or prescription medications as directed by your health care provider.   Do not drink alcohol, especially if you are breastfeeding or taking medication to relieve pain.   Do not chew or smoke tobacco.   Continue to use good perineal care. Good perineal care includes:    Wiping your perineum from front to back.    Keeping your perineum clean.   Check your surgical cut (incision) daily for increased redness, drainage, swelling, or separation of skin.   Clean your incision gently with soap and water every day, and then pat it dry. If your health care provider says it is okay, leave the incision uncovered. Use a bandage (dressing) if the incision is draining fluid or appears irritated. If the adhesive strips across the incision do not fall off within 7 days, carefully peel them off.   Hug a pillow when coughing or sneezing until your incision is healed. This helps to relieve pain.   Do not use tampons or douche until your health care provider says it is okay.   Shower, wash your hair, and take tub baths as directed by your health care provider.   Wear a well-fitting bra that provides breast support.   Limit wearing support panties or control-top hose.   Drink enough fluids to keep your urine clear or pale yellow.   Eat high-fiber foods such as whole grain cereals and breads, brown rice, beans, and fresh fruits and vegetables every day. These foods may help prevent or relieve constipation.   Resume activities such as climbing stairs,  driving, lifting, exercising, or traveling as directed by your health care provider.   Talk to your health care provider about resuming sexual activities. This is dependent upon your risk of infection, your rate of healing, and your comfort and desire to resume sexual activity.   Try to have someone help you with your household activities and your newborn for at least a few days after you leave the hospital.   Rest as much as possible. Try to rest or take a nap when your newborn is sleeping.   Increase your activities gradually.   Keep all of your scheduled postpartum appointments. It is very important to keep your scheduled follow-up appointments. At these appointments, your health care provider will be checking to make sure that you are healing physically and emotionally.  SEEK MEDICAL CARE IF:    You are passing large clots from your vagina. Save any clots to show your health care provider.   You have a foul smelling discharge from your vagina.   You have trouble urinating.   You are urinating frequently.   You have pain when you urinate.   You have a change in your bowel movements.   You have increasing redness, pain, or swelling near your incision.   You have pus draining from your incision.   Your incision is separating.   You have painful, hard, or reddened breasts.   You have a severe headache.   You have blurred vision or see spots.   You feel sad   or depressed.   You have thoughts of hurting yourself or your newborn.   You have questions about your care, the care of your newborn, or medications.   You are dizzy or light-headed.   You have a rash.   You have pain, redness, or swelling at the site of the removed intravenous access (IV) tube.   You have nausea or vomiting.   You stopped breastfeeding and have not had a menstrual period within 12 weeks of stopping.   You are not breastfeeding and have not had a menstrual period within 12 weeks of delivery.   You have a fever.  SEEK  IMMEDIATE MEDICAL CARE IF:   You have persistent pain.   You have chest pain.   You have shortness of breath.   You faint.   You have leg pain.   You have stomach pain.   Your vaginal bleeding saturates 2 or more sanitary pads in 1 hour.  MAKE SURE YOU:    Understand these instructions.   Will watch your condition.   Will get help right away if you are not doing well or get worse.     This information is not intended to replace advice given to you by your health care provider. Make sure you discuss any questions you have with your health care provider.     Document Released: 02/05/2002 Document Revised: 06/06/2014 Document Reviewed: 01/11/2012  Elsevier Interactive Patient Education 2016 [employer]

## 2015-04-19 NOTE — MAU Note (Signed)
Pt reports she had the PICO drsg when she had her C/S on Thursday and it was changed today and since then has continued to alarm. Pt currently has it turned off.

## 2015-04-19 NOTE — MAU Provider Note (Signed)
History     CSN: [id number]  Arrival date and time: 04/19/15 2142   First Provider Initiated Contact with Patient 04/19/15 2303      No chief complaint on file.  HPI Comments:  Christine Weber is a 34 y.o. [id number] who is S/P c-section. She went home today, but states that her woundvac is continuing to make a buzzing noise. It was doing it while here, and they changed the dressing prior to DC.  Wound Check She was originally treated 2 to 3 days ago. Prior ED Treatment: c-section  The maximum temperature noted was less than 100.4 F. The temperature was taken using an axillary reading. There has been no drainage from the wound. There is no redness present. There is no swelling present. The pain has no pain.      Past Medical History  Diagnosis Date  . Heart murmur     as a child - no problem as adult  . Anemia   . Obesity complicating pregnancy 05/21/2013  . S/P cesarean section 05/22/2013    Past Surgical History  Procedure Laterality Date  . Oral surgry    . Tonsillectomy    . Cesarean section  2007, 2012    x 2  in [place]  . Wisdom tooth extraction    . Cesarean section N/A 05/22/2013    Procedure: CESAREAN SECTION;  Surgeon: Sherron Monday, MD;  Location: [facility];  Service: Obstetrics;  Laterality: N/A;  . Cesarean section N/A 04/16/2015    Procedure: CESAREAN SECTION;  Surgeon: Levi Aland, MD;  Location: [facility];  Service: Obstetrics;  Laterality: N/A;    Family History  Problem Relation Age of Onset  . Diabetes Mother   . Hyperlipidemia Mother   . Thyroid disease Mother   . Hypertension Mother   . Cancer Father     Kidney  . Hypertension Brother   . Diabetes Brother   . Deep vein thrombosis Brother   . Cancer Maternal Grandfather     Prostate  . Kidney disease Paternal Grandmother   . Cancer Paternal Grandfather     Prostate    Social History  Substance Use Topics  . Smoking status: Never Smoker   . Smokeless tobacco: Never Used  . Alcohol Use: Yes      Comment: occasionally but none with pregnancy    Allergies: No Known Allergies  Prescriptions prior to admission  Medication Sig Dispense Refill Last Dose  . docusate sodium (COLACE) 100 MG capsule Take 1 capsule (100 mg total) by mouth 2 (two) times daily. 60 capsule 0 04/18/2015 at Unknown time  . ibuprofen (ADVIL,MOTRIN) 600 MG tablet Take 1 tablet (600 mg total) by mouth every 6 (six) hours as needed. 90 tablet 0 t at 1900  . ibuprofen (MOTRIN IB) 200 MG tablet Take 1 tablet (200 mg total) by mouth every 6 (six) hours as needed. 30 tablet 0   . Iron TABS Take 1 tablet by mouth daily.   04/16/2015 at Unknown time  . oxyCODONE-acetaminophen (ROXICET) 5-325 MG tablet Take 1-2 tablets by mouth every 4 (four) hours as needed for severe pain. 46 tablet 0   . Prenatal Vit-Fe Fumarate-FA (PRENATAL MULTIVITAMIN) TABS Take 1 tablet by mouth every morning.   04/16/2015 at Unknown time    Review of Systems  Constitutional: Negative for fever and chills.  Gastrointestinal: Negative for nausea, vomiting, abdominal pain, diarrhea and constipation.  Genitourinary: Negative for dysuria, urgency and frequency.   Physical Exam   Blood  pressure 117/61, pulse 78, temperature 98.6 F (37 C), temperature source Oral, resp. rate 20, last menstrual period 07/23/2014, SpO2 100 %, unknown if currently breastfeeding.  Physical Exam  Nursing note and vitals reviewed. Constitutional: She is oriented to person, place, and time. She appears well-developed and well-nourished. No distress.  HENT:  Head: Normocephalic.  Cardiovascular: Normal rate.   Respiratory: Effort normal.  GI: Soft.  Wound vac dressing removed. Area inspected. Incision is C/D/I. New wound vac dressing applied, and machine turned on. Machine did make a suction noise initially, but after several mins the noise subsided. Good seal was noted.   Neurological: She is alert and oriented to person, place, and time.  Skin: Skin is warm and dry.   Psychiatric: She has a normal mood and affect.    MAU Course  Procedures  MDM 0000: After about 20 mins woundvac started to create a suction noise again. Replaced wound vac device at this time.  Patient DC home with new wound vac device, and instructions to notify office if problems continue. Incision is healing well at this time. Consider applying honeycomb only if device continues to cause problems   Assessment and Plan   1. Dressing change or removal, surgical wound   2. Incisional pain    DC home Comfort measures reviewed  RX: none  Return to MAU as needed  Follow-up Information    Follow up with Levi AlandANDERSON,MARK E, MD.   Specialty:  Obstetrics and Gynecology   Why:  As needed   Contact information:   [address] [place] [phone]         Christine Weber, Christine Weber 04/19/2015, 11:22 PM

## 2015-04-20 LAB — TYPE AND SCREEN
ABO/RH(D): O POS
ANTIBODY SCREEN: NEGATIVE
Unit division: 0
Unit division: 0

## 2015-04-21 ENCOUNTER — Other Ambulatory Visit (HOSPITAL_COMMUNITY): Payer: Medicaid Other

## 2015-04-22 ENCOUNTER — Encounter (HOSPITAL_COMMUNITY): Admission: RE | Payer: Self-pay | Source: Ambulatory Visit

## 2015-04-22 ENCOUNTER — Inpatient Hospital Stay (HOSPITAL_COMMUNITY)
Admission: RE | Admit: 2015-04-22 | Payer: Medicaid Other | Source: Ambulatory Visit | Admitting: Obstetrics & Gynecology

## 2015-04-22 SURGERY — Surgical Case
Anesthesia: Regional

## 2020-07-08 ENCOUNTER — Other Ambulatory Visit: Payer: Self-pay | Admitting: Surgical Oncology

## 2020-07-08 DIAGNOSIS — K449 Diaphragmatic hernia without obstruction or gangrene: Secondary | ICD-10-CM

## 2020-07-14 ENCOUNTER — Other Ambulatory Visit: Payer: Self-pay | Admitting: Surgical Oncology

## 2020-07-14 ENCOUNTER — Ambulatory Visit
Admission: RE | Admit: 2020-07-14 | Discharge: 2020-07-14 | Disposition: A | Discharge status: Home / Self Care | Payer: BC Managed Care – PPO | Source: Ambulatory Visit | Attending: Surgical Oncology | Admitting: Surgical Oncology

## 2020-07-14 DIAGNOSIS — K449 Diaphragmatic hernia without obstruction or gangrene: Secondary | ICD-10-CM

## 2021-04-24 ENCOUNTER — Emergency Department (HOSPITAL_COMMUNITY)
Admission: EM | Admit: 2021-04-24 | Discharge: 2021-04-24 | Disposition: A | Discharge status: Home / Self Care | Payer: BC Managed Care – PPO | Attending: Emergency Medicine | Admitting: Emergency Medicine

## 2021-04-24 ENCOUNTER — Other Ambulatory Visit: Payer: Self-pay

## 2021-04-24 DIAGNOSIS — R109 Unspecified abdominal pain: Secondary | ICD-10-CM

## 2021-04-24 DIAGNOSIS — R002 Palpitations: Secondary | ICD-10-CM | POA: Diagnosis not present

## 2021-04-24 DIAGNOSIS — I951 Orthostatic hypotension: Secondary | ICD-10-CM | POA: Insufficient documentation

## 2021-04-24 DIAGNOSIS — R1084 Generalized abdominal pain: Secondary | ICD-10-CM | POA: Diagnosis not present

## 2021-04-24 DIAGNOSIS — R008 Other abnormalities of heart beat: Secondary | ICD-10-CM

## 2021-04-24 DIAGNOSIS — N9489 Other specified conditions associated with female genital organs and menstrual cycle: Secondary | ICD-10-CM | POA: Diagnosis not present

## 2021-04-24 LAB — URINALYSIS, ROUTINE W REFLEX MICROSCOPIC
Bilirubin Urine: NEGATIVE
Glucose, UA: NEGATIVE mg/dL
Hgb urine dipstick: NEGATIVE
Ketones, ur: 80 mg/dL — AB
Leukocytes,Ua: NEGATIVE
Nitrite: NEGATIVE
Protein, ur: 30 mg/dL — AB
Specific Gravity, Urine: 1.029 (ref 1.005–1.030)
pH: 5 (ref 5.0–8.0)

## 2021-04-24 LAB — COMPREHENSIVE METABOLIC PANEL
ALT: 31 U/L (ref 0–44)
AST: 25 U/L (ref 15–41)
Albumin: 3.6 g/dL (ref 3.5–5.0)
Alkaline Phosphatase: 37 U/L — ABNORMAL LOW (ref 38–126)
Anion gap: 12 (ref 5–15)
BUN: 11 mg/dL (ref 6–20)
CO2: 20 mmol/L — ABNORMAL LOW (ref 22–32)
Calcium: 8.7 mg/dL — ABNORMAL LOW (ref 8.9–10.3)
Chloride: 105 mmol/L (ref 98–111)
Creatinine, Ser: 0.58 mg/dL (ref 0.44–1.00)
GFR, Estimated: >60 mL/min (ref >60)
Glucose, Bld: 76 mg/dL (ref 70–99)
Potassium: 3.5 mmol/L (ref 3.5–5.1)
Sodium: 137 mmol/L (ref 135–145)
Total Bilirubin: 1.2 mg/dL (ref 0.3–1.2)
Total Protein: 7 g/dL (ref 6.5–8.1)

## 2021-04-24 LAB — CBC
HCT: 41.4 % (ref 36.0–46.0)
Hemoglobin: 13.3 g/dL (ref 12.0–15.0)
MCH: 31 pg (ref 26.0–34.0)
MCHC: 32.1 g/dL (ref 30.0–36.0)
MCV: 96.5 fL (ref 80.0–100.0)
Platelets: 133 10*3/uL — ABNORMAL LOW (ref 150–400)
RBC: 4.29 MIL/uL (ref 3.87–5.11)
RDW: 13.9 % (ref 11.5–15.5)
WBC: 5.9 10*3/uL (ref 4.0–10.5)
nRBC: 0 % (ref 0.0–0.2)

## 2021-04-24 LAB — HCG, QUANTITATIVE, PREGNANCY: hCG, Beta Chain, Quant, S: <1 m[IU]/mL (ref <5)

## 2021-04-24 LAB — LIPASE, BLOOD: Lipase: 28 U/L (ref 11–51)

## 2021-04-24 MED ORDER — SODIUM CHLORIDE 0.9 % IV BOLUS
1000.0000 mL | Freq: Once | INTRAVENOUS | Status: AC
  Administered 2021-04-24: 1000 mL via INTRAVENOUS

## 2021-04-24 MED ORDER — ONDANSETRON 4 MG PO TBDP: 4.0000 mg | ORAL_TABLET | Freq: Once | ORAL | Status: DC | PRN

## 2021-04-24 MED ORDER — ONDANSETRON HCL 4 MG/2ML IJ SOLN
4.0000 mg | Freq: Once | INTRAMUSCULAR | Status: DC
  Filled 2021-04-24: qty 2

## 2021-04-24 NOTE — Discharge Instructions (Signed)
Your work-up is reassuring today.  I have added an office for you to see in regards to your heart palpitations.  Please make an appointment.  If your abdominal symptoms continue I believe it is important to follow-up with your gastroenterologist/surgeon.

## 2021-04-24 NOTE — ED Provider Notes (Signed)
[facility] DEPT Provider Note   CSN: [id number] Arrival date & time: 04/24/21  1046     History Chief Complaint  Patient presents with   Abdominal Pain    Christine Weber is a 40 y.o. 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  The abdominal cramping is only present when she eats or drinks.  No specific types of drinks and food.  Heart palpitations happen when she moves around or goes from sitting to standing.  No chest pain or shortness of breath.  No nausea, vomiting or change in stool patterns.  Last menstrual period in all this, on continuous OCP.  No urinary symptoms. She thinks that she may be having an upset stomach because she is under a lot of stress and not eating as well.     Past Medical History:  Diagnosis Date   Anemia    Heart murmur    as a child - no problem as adult   Obesity complicating pregnancy [id number]   S/P cesarean section 05/22/2013    Patient Active Problem List   Diagnosis Date Noted   Pregnancy 04/16/2015   Abnormal finding on ultrasound    Fetal pericardial effusion    [redacted] weeks gestation of pregnancy    Uterine size date discrepancy pregnancy    S/P cesarean section [id number]   Obesity complicating pregnancy [id number]   IUGR (intrauterine growth restriction) affecting care of mother 05/14/2013    Past Surgical History:  Procedure Laterality Date   CESAREAN SECTION  2007, 2012   x 2  in [place] N/A 05/22/2013   Procedure: CESAREAN SECTION;  Surgeon: Thornell Sartorius, MD;  Location: [facility];  Service: Obstetrics;  Laterality: N/A;   CESAREAN SECTION N/A 04/16/2015   Procedure: CESAREAN SECTION;  Surgeon: Olga Millers, MD;  Location: [facility];  Service: Obstetrics;  Laterality: N/A;   oral surgry     TONSILLECTOMY     WISDOM TOOTH EXTRACTION       OB History     Gravida  4   Para   4   Term  4   Preterm      AB      Living  4      SAB      IAB      Ectopic      Multiple  0   Live Births  4           Family History  Problem Relation Age of Onset   Diabetes Mother    Hyperlipidemia Mother    Thyroid disease Mother    Hypertension Mother    Cancer Father        Kidney   Hypertension Brother    Diabetes Brother    Deep vein thrombosis Brother    Cancer Maternal Grandfather        Prostate   Kidney disease Paternal Grandmother    Cancer Paternal Grandfather        Prostate    Social History   Tobacco Use   Smoking status: Never   Smokeless tobacco: Never  Substance Use Topics   Alcohol use: Yes    Comment: occasionally but none with pregnancy   Drug use: No    Home Medications Prior to Admission medications   Medication Sig Start Date End Date Taking? Authorizing Provider  doxycycline (VIBRA-TABS) 100 MG tablet Take  100 mg by mouth 2 (two) times daily. 04/04/21  Yes [provider]  Multiple Vitamins-Minerals (BARIATRIC MULTIVITAMINS/IRON PO) Take 1 tablet by mouth daily.   Yes [provider]  NUVARING 0.12-0.015 MG/24HR vaginal ring Place 1 each vaginally every 30 (thirty) days. 03/29/21  Yes [provider]  omeprazole (PRILOSEC) 20 MG capsule Take 20 mg by mouth in the morning and at bedtime.   Yes [provider]    Allergies    Patient has no known allergies.  Review of Systems   Review of Systems  Constitutional:  Negative for fever.  Respiratory:  Negative for chest tightness and shortness of breath.   Cardiovascular:  Positive for palpitations. Negative for chest pain.  Gastrointestinal:  Positive for abdominal pain. Negative for nausea and vomiting.  Genitourinary:  Negative for dysuria and hematuria.  Neurological:  Negative for dizziness.  All other systems reviewed and are negative.  Physical Exam Updated Vital Signs BP 106/73   Pulse 62   Temp 98 F (36.7 C) (Oral)    Resp (!) 22   Ht 5\' 3"  (1.6 m)   Wt 107 kg   LMP 02/06/2021 (Approximate)   SpO2 100%   BMI 41.81 kg/m   Physical Exam Vitals and nursing note reviewed.  Constitutional:      Appearance: Normal appearance. She is well-developed. She is not ill-appearing.  HENT:     Head: Normocephalic and atraumatic.  Eyes:     General: No scleral icterus.    Conjunctiva/sclera: Conjunctivae normal.  Pulmonary:     Effort: Pulmonary effort is normal. No respiratory distress.  Abdominal:     General: Abdomen is flat.     Palpations: Abdomen is soft.     Tenderness: There is no abdominal tenderness. There is no guarding.     Hernia: No hernia is present.  Skin:    General: Skin is warm and dry.     Findings: No rash.  Neurological:     Mental Status: She is alert.  Psychiatric:        Mood and Affect: Mood normal.        Behavior: Behavior normal.    ED Results / Procedures / Treatments   Labs (all labs ordered are listed, but only abnormal results are displayed) Labs Reviewed  COMPREHENSIVE METABOLIC PANEL - Abnormal; Notable for the following components:      Result Value   CO2 20 (*)    Calcium 8.7 (*)    Alkaline Phosphatase 37 (*)    All other components within normal limits  CBC - Abnormal; Notable for the following components:   Platelets 133 (*)    All other components within normal limits  URINALYSIS, ROUTINE W REFLEX MICROSCOPIC - Abnormal; Notable for the following components:   Color, Urine AMBER (*)    Ketones, ur 80 (*)    Protein, ur 30 (*)    Bacteria, UA RARE (*)    All other components within normal limits  LIPASE, BLOOD  HCG, QUANTITATIVE, PREGNANCY    EKG None  Radiology No results found.  Procedures Procedures   Medications Ordered in ED Medications  ondansetron (ZOFRAN) injection 4 mg (4 mg Intravenous Not Given 04/24/21 1257)  sodium chloride 0.9 % bolus 1,000 mL (1,000 mLs Intravenous Bolus 04/24/21 1258)    ED Course  I have reviewed the  triage vital signs and the nursing notes.  Pertinent labs & imaging results that were available during my care of the patient were  reviewed by me and considered in my medical decision making (see chart for details).    MDM Rules/Calculators/A&P The differential diagnosis for generalized abdominal pain includes, but is not limited to AAA, gastroenteritis, appendicitis, Bowel obstruction, Bowel perforation. Gastroparesis, DKA, Hernia, Inflammatory bowel disease, mesenteric ischemia, pancreatitis, peritonitis SBP, volvulus.   All of these were considered throughout the evaluation of the patient.  Her abdominal pain seems to only be brought on by eating and drinking.  I cannot elicit it on physical exam.  She has not had any issues since her surgery, I doubt any mechanical abnormality in the abdomen.  No CT ordered.  Lab work non-revealing.  Urinalysis shows some protein and ketones, likely secondary to dehydration.  Do not believe we need to treat a urinary tract infection today.  EKG in sinus rhythm today.  Orthostatics were positive with her heart rate has been 70 sitting to 102 standing.  She reports at that time she also gets dizzy. I believe she is to follow-up with a cardiologist to evaluate her for potential premature beats.  She has been given a referral.  If she continues to have GI symptoms it is a good idea for her to follow-up with her gastroenterologist as well.    Final Clinical Impression(s) / ED Diagnoses Final diagnoses:  Symptomatic orthostatic increase in heart rate  Abdominal cramping    Rx / DC Orders Results and diagnoses were explained to the patient. Return precautions discussed in full. Patient had no additional questions and expressed complete understanding.     Saddie Benders, PA-C 04/24/21 1506    Tanda Rockers A, DO 04/25/21 1626

## 2021-04-24 NOTE — ED Triage Notes (Signed)
Patient reports having weight loss surgery in august, says yesterday when she drinks her stomach cramps and feels like heart is racing. Denies pain in triage. Says symptoms are only when she  drinks/eats
